# Patient Record
Sex: Female | Born: 1975 | Hispanic: Yes | Smoking: Never smoker
Health system: Southern US, Community
[De-identification: ages and names within clinical notes are randomized; demographics above are authoritative.]

## PROBLEM LIST (undated history)

## (undated) ENCOUNTER — Inpatient Hospital Stay (HOSPITAL_COMMUNITY): Payer: Self-pay

## (undated) DIAGNOSIS — Z8759 Personal history of other complications of pregnancy, childbirth and the puerperium: Secondary | ICD-10-CM

## (undated) DIAGNOSIS — O09523 Supervision of elderly multigravida, third trimester: Secondary | ICD-10-CM

## (undated) DIAGNOSIS — K8020 Calculus of gallbladder without cholecystitis without obstruction: Secondary | ICD-10-CM

---

## 2011-08-15 LAB — ANTIBODY SCREEN
Antibody Screen: NEGATIVE
Antibody Screen: NEGATIVE

## 2011-08-15 LAB — HEPATITIS B SURFACE ANTIGEN: Hepatitis B Surface Ag: NEGATIVE

## 2011-08-15 LAB — HIV ANTIBODY (ROUTINE TESTING W REFLEX): HIV: NONREACTIVE

## 2011-08-15 LAB — RPR: RPR: NONREACTIVE

## 2011-08-15 LAB — GC/CHLAMYDIA PROBE AMP, GENITAL
Chlamydia: NEGATIVE
Gonorrhea: NEGATIVE

## 2011-08-15 LAB — RUBELLA ANTIBODY, IGM
Rubella: IMMUNE
Rubella: IMMUNE

## 2011-08-15 LAB — ABO/RH: RH Type: POSITIVE

## 2011-08-16 ENCOUNTER — Other Ambulatory Visit: Payer: Self-pay | Admitting: Family Medicine

## 2011-08-16 DIAGNOSIS — Z3689 Encounter for other specified antenatal screening: Secondary | ICD-10-CM

## 2011-08-22 ENCOUNTER — Ambulatory Visit (HOSPITAL_COMMUNITY)
Admission: RE | Admit: 2011-08-22 | Discharge: 2011-08-22 | Disposition: A | Discharge status: Home / Self Care | Payer: Self-pay | Source: Ambulatory Visit | Attending: Family Medicine | Admitting: Family Medicine

## 2011-08-22 DIAGNOSIS — O09529 Supervision of elderly multigravida, unspecified trimester: Secondary | ICD-10-CM | POA: Insufficient documentation

## 2011-08-22 DIAGNOSIS — O358XX0 Maternal care for other (suspected) fetal abnormality and damage, not applicable or unspecified: Secondary | ICD-10-CM | POA: Insufficient documentation

## 2011-08-22 DIAGNOSIS — Z3689 Encounter for other specified antenatal screening: Secondary | ICD-10-CM

## 2011-08-22 DIAGNOSIS — Z363 Encounter for antenatal screening for malformations: Secondary | ICD-10-CM | POA: Insufficient documentation

## 2011-08-22 DIAGNOSIS — Z1389 Encounter for screening for other disorder: Secondary | ICD-10-CM | POA: Insufficient documentation

## 2011-11-07 NOTE — L&D Delivery Note (Signed)
Delivery Note At 9:59 PM a viable and healthy female was delivered via  (Presentation: vertex;  ).  APGAR:8 ,9 ; weight 3399g.   Placenta status:spontaneous ,intact .  Cord: 3vessel, delivered through nuchal cord  with the following complications:none .  Cord pH: not indicated  Anesthesia:  none Episiotomy: none Lacerations: none Suture Repair: not indicated Est. Blood Loss (mL):  Mom to postpartum.  Baby to nursery-stable  Cathie Beams, CNM, precepting delivery.  Cameron Proud 12/02/2011, 10:15 PM

## 2011-11-15 ENCOUNTER — Other Ambulatory Visit (HOSPITAL_COMMUNITY): Payer: Self-pay | Admitting: Physician Assistant

## 2011-11-15 DIAGNOSIS — Z3689 Encounter for other specified antenatal screening: Secondary | ICD-10-CM

## 2011-11-17 ENCOUNTER — Ambulatory Visit (HOSPITAL_COMMUNITY)
Admission: RE | Admit: 2011-11-17 | Discharge: 2011-11-17 | Disposition: A | Discharge status: Home / Self Care | Payer: Self-pay | Source: Ambulatory Visit | Attending: Physician Assistant | Admitting: Physician Assistant

## 2011-11-17 DIAGNOSIS — O09529 Supervision of elderly multigravida, unspecified trimester: Secondary | ICD-10-CM | POA: Insufficient documentation

## 2011-11-17 DIAGNOSIS — Z3689 Encounter for other specified antenatal screening: Secondary | ICD-10-CM | POA: Insufficient documentation

## 2011-11-21 ENCOUNTER — Other Ambulatory Visit (HOSPITAL_COMMUNITY): Payer: Self-pay | Admitting: Family

## 2011-11-21 DIAGNOSIS — O09299 Supervision of pregnancy with other poor reproductive or obstetric history, unspecified trimester: Secondary | ICD-10-CM

## 2011-11-21 DIAGNOSIS — Z363 Encounter for antenatal screening for malformations: Secondary | ICD-10-CM

## 2011-11-21 DIAGNOSIS — Z1389 Encounter for screening for other disorder: Secondary | ICD-10-CM

## 2011-11-21 LAB — STREP B DNA PROBE: GBS: NEGATIVE

## 2011-11-24 ENCOUNTER — Ambulatory Visit (HOSPITAL_COMMUNITY): Admission: RE | Admit: 2011-11-24 | Payer: Self-pay | Source: Ambulatory Visit

## 2011-11-28 ENCOUNTER — Ambulatory Visit (HOSPITAL_COMMUNITY)
Admission: RE | Admit: 2011-11-28 | Discharge: 2011-11-28 | Disposition: A | Discharge status: Home / Self Care | Payer: Self-pay | Source: Ambulatory Visit | Attending: Family | Admitting: Family

## 2011-11-28 DIAGNOSIS — O09299 Supervision of pregnancy with other poor reproductive or obstetric history, unspecified trimester: Secondary | ICD-10-CM | POA: Insufficient documentation

## 2011-11-28 DIAGNOSIS — O09529 Supervision of elderly multigravida, unspecified trimester: Secondary | ICD-10-CM | POA: Insufficient documentation

## 2011-12-01 ENCOUNTER — Other Ambulatory Visit (HOSPITAL_COMMUNITY): Payer: Self-pay | Admitting: Nurse Practitioner

## 2011-12-01 DIAGNOSIS — O09299 Supervision of pregnancy with other poor reproductive or obstetric history, unspecified trimester: Secondary | ICD-10-CM

## 2011-12-02 ENCOUNTER — Inpatient Hospital Stay (HOSPITAL_COMMUNITY)
Admission: AD | Admit: 2011-12-02 | Discharge: 2011-12-04 | DRG: 775 | Disposition: A | Discharge status: Home / Self Care | Payer: Medicaid Other | Source: Ambulatory Visit | Attending: Obstetrics & Gynecology | Admitting: Obstetrics & Gynecology

## 2011-12-02 ENCOUNTER — Encounter (HOSPITAL_COMMUNITY): Payer: Self-pay | Admitting: *Deleted

## 2011-12-02 DIAGNOSIS — O09529 Supervision of elderly multigravida, unspecified trimester: Secondary | ICD-10-CM

## 2011-12-02 LAB — CBC
Hemoglobin: 11.3 g/dL — ABNORMAL LOW (ref 12.0–15.0)
MCH: 27.2 pg (ref 26.0–34.0)
Platelets: 230 10*3/uL (ref 150–400)
RBC: 4.15 MIL/uL (ref 3.87–5.11)
WBC: 15.6 10*3/uL — ABNORMAL HIGH (ref 4.0–10.5)

## 2011-12-02 LAB — ABO/RH: ABO/RH(D): O POS

## 2011-12-02 MED ORDER — ONDANSETRON HCL 4 MG/2ML IJ SOLN: 4.0000 mg | INTRAMUSCULAR | Status: DC | PRN

## 2011-12-02 MED ORDER — TETANUS-DIPHTH-ACELL PERTUSSIS 5-2.5-18.5 LF-MCG/0.5 IM SUSP: 0.5000 mL | Freq: Once | INTRAMUSCULAR | Status: DC

## 2011-12-02 MED ORDER — OXYCODONE-ACETAMINOPHEN 5-325 MG PO TABS: 2.0000 | ORAL_TABLET | ORAL | Status: DC | PRN

## 2011-12-02 MED ORDER — LANOLIN HYDROUS EX OINT: TOPICAL_OINTMENT | CUTANEOUS | Status: DC | PRN

## 2011-12-02 MED ORDER — FLEET ENEMA 7-19 GM/118ML RE ENEM: 1.0000 | ENEMA | RECTAL | Status: DC | PRN

## 2011-12-02 MED ORDER — LACTATED RINGERS IV SOLN: INTRAVENOUS | Status: DC

## 2011-12-02 MED ORDER — SIMETHICONE 80 MG PO CHEW: 80.0000 mg | CHEWABLE_TABLET | ORAL | Status: DC | PRN

## 2011-12-02 MED ORDER — IBUPROFEN 600 MG PO TABS
600.0000 mg | ORAL_TABLET | Freq: Four times a day (QID) | ORAL | Status: DC
  Administered 2011-12-03 – 2011-12-04 (×6): 600 mg via ORAL
  Filled 2011-12-02 (×6): qty 1

## 2011-12-02 MED ORDER — ACETAMINOPHEN 325 MG PO TABS: 650.0000 mg | ORAL_TABLET | ORAL | Status: DC | PRN

## 2011-12-02 MED ORDER — DIPHENHYDRAMINE HCL 25 MG PO CAPS: 25.0000 mg | ORAL_CAPSULE | Freq: Four times a day (QID) | ORAL | Status: DC | PRN

## 2011-12-02 MED ORDER — BENZOCAINE-MENTHOL 20-0.5 % EX AERO: 1.0000 "application " | INHALATION_SPRAY | CUTANEOUS | Status: DC | PRN

## 2011-12-02 MED ORDER — SENNOSIDES-DOCUSATE SODIUM 8.6-50 MG PO TABS
2.0000 | ORAL_TABLET | Freq: Every day | ORAL | Status: DC
  Administered 2011-12-03: 2 via ORAL

## 2011-12-02 MED ORDER — PRENATAL MULTIVITAMIN CH
1.0000 | ORAL_TABLET | Freq: Every day | ORAL | Status: DC
  Administered 2011-12-03 – 2011-12-04 (×2): 1 via ORAL
  Filled 2011-12-02 (×2): qty 1

## 2011-12-02 MED ORDER — WITCH HAZEL-GLYCERIN EX PADS: 1.0000 "application " | MEDICATED_PAD | CUTANEOUS | Status: DC | PRN

## 2011-12-02 MED ORDER — ONDANSETRON HCL 4 MG/2ML IJ SOLN: 4.0000 mg | Freq: Four times a day (QID) | INTRAMUSCULAR | Status: DC | PRN

## 2011-12-02 MED ORDER — ONDANSETRON HCL 4 MG PO TABS: 4.0000 mg | ORAL_TABLET | ORAL | Status: DC | PRN

## 2011-12-02 MED ORDER — LACTATED RINGERS IV SOLN: 500.0000 mL | INTRAVENOUS | Status: DC | PRN

## 2011-12-02 MED ORDER — NALBUPHINE SYRINGE 5 MG/0.5 ML: 5.0000 mg | INJECTION | INTRAMUSCULAR | Status: DC | PRN

## 2011-12-02 MED ORDER — FENTANYL CITRATE 0.05 MG/ML IJ SOLN
50.0000 ug | INTRAMUSCULAR | Status: DC | PRN
  Administered 2011-12-02: 50 ug via INTRAVENOUS
  Filled 2011-12-02: qty 2

## 2011-12-02 MED ORDER — ZOLPIDEM TARTRATE 5 MG PO TABS: 5.0000 mg | ORAL_TABLET | Freq: Every evening | ORAL | Status: DC | PRN

## 2011-12-02 MED ORDER — LIDOCAINE HCL (PF) 1 % IJ SOLN
30.0000 mL | INTRAMUSCULAR | Status: DC | PRN
  Filled 2011-12-02: qty 30

## 2011-12-02 MED ORDER — OXYTOCIN 20 UNITS IN LACTATED RINGERS INFUSION - SIMPLE
125.0000 mL/h | Freq: Once | INTRAVENOUS | Status: AC
  Administered 2011-12-02: 500 mL/h via INTRAVENOUS

## 2011-12-02 MED ORDER — DIBUCAINE 1 % RE OINT: 1.0000 "application " | TOPICAL_OINTMENT | RECTAL | Status: DC | PRN

## 2011-12-02 MED ORDER — OXYCODONE-ACETAMINOPHEN 5-325 MG PO TABS
1.0000 | ORAL_TABLET | ORAL | Status: DC | PRN
  Administered 2011-12-03 (×2): 1 via ORAL
  Filled 2011-12-02 (×2): qty 1

## 2011-12-02 MED ORDER — OXYTOCIN BOLUS FROM INFUSION
500.0000 mL | Freq: Once | INTRAVENOUS | Status: DC
  Filled 2011-12-02: qty 500
  Filled 2011-12-02: qty 1000

## 2011-12-02 MED ORDER — CITRIC ACID-SODIUM CITRATE 334-500 MG/5ML PO SOLN: 30.0000 mL | ORAL | Status: DC | PRN

## 2011-12-02 MED ORDER — IBUPROFEN 600 MG PO TABS: 600.0000 mg | ORAL_TABLET | Freq: Four times a day (QID) | ORAL | Status: DC | PRN

## 2011-12-02 NOTE — Progress Notes (Signed)
Possible SROM at 1100 today, clear fluid.

## 2011-12-02 NOTE — Progress Notes (Signed)
Pt transferred to room 136 at 2315 via wheelchair, support person present, report given to mbw rn

## 2011-12-02 NOTE — H&P (Signed)
Beverly Flynn is a 36 y.o. female presenting for SROM  Mother has no complaints., No complications with this pregnancy , good fetal movement SROM around 11am today. Denies fevers. Maternal Medical History:  Reason for admission: Reason for admission: rupture of membranes.  Reason for Admission:   nauseaContractions: Frequency: regular.   Perceived severity is strong.    Fetal activity: Perceived fetal activity is normal.   Last perceived fetal movement was within the past hour.      OB History    Grav Para Term Preterm Abortions TAB SAB Ect Mult Living   8 6 6  1  1   6      Past Medical History  Diagnosis Date  . No pertinent past medical history    Past Surgical History  Procedure Date  . No past surgeries    Family History: family history is not on file. Social History:  reports that she has never smoked. She has never used smokeless tobacco. She reports that she does not drink alcohol or use illicit drugs.  Review of Systems  Constitutional: Negative for fever and chills.  Eyes: Negative for blurred vision and double vision.  Cardiovascular: Negative for chest pain.  Gastrointestinal: Negative for nausea and vomiting.  Skin: Negative for itching and rash.  Neurological: Negative for headaches.  All other systems reviewed and are negative.    Dilation: 6.5 Effacement (%): 100 Station: -1 Exam by:: e.foley,rn Blood pressure 135/68, pulse 106, temperature 98.4 F (36.9 C), temperature source Oral, resp. rate 20, height 5\' 1"  (1.549 m), weight 176 lb (79.833 kg). Maternal Exam:  Uterine Assessment: Contraction strength is firm.  Introitus: Normal vulva. Normal vagina.  Ferning test: positive.      Fetal Exam Fetal Monitor Review: Variability: moderate (6-25 bpm).   Pattern: accelerations present.    Fetal State Assessment: Category I - tracings are normal.     Physical Exam  Nursing note and vitals reviewed. Constitutional: She is oriented to  person, place, and time. She appears well-developed and well-nourished. No distress.  HENT:  Head: Normocephalic and atraumatic.  Eyes: EOM are normal. Pupils are equal, round, and reactive to light.  Cardiovascular: Normal rate, regular rhythm, normal heart sounds and intact distal pulses.  Exam reveals no gallop and no friction rub.   No murmur heard. Respiratory: Breath sounds normal. No respiratory distress. She has no wheezes. She has no rales. She exhibits no tenderness.  GI: Soft. She exhibits no distension and no mass. There is no tenderness. There is no rebound and no guarding.  Musculoskeletal: Normal range of motion. She exhibits no edema and no tenderness.  Neurological: She is alert and oriented to person, place, and time. She has normal reflexes. No cranial nerve deficit. Coordination normal.  Skin: Skin is warm and dry. She is not diaphoretic.  Psychiatric: She has a normal mood and affect. Her behavior is normal. Judgment and thought content normal.    Prenatal labs: ABO, Rh: O, O/Positive, Positive/-- (10/09 0000) Antibody: Negative, Negative (10/09 0000) Rubella: Immune, Immune (10/09 0000) RPR: Nonreactive, Nonreactive (10/09 0000)  HBsAg: Negative, Negative (10/09 0000)  HIV: Non-reactive, Non-reactive (10/09 0000)  GBS: Negative (01/16 0000)   Assessment/Plan: SROM  - admit to L&D  - expect SVD  - GBS neg  - fentanyl for pain control  - routine care  Cameron Proud 12/02/2011, 10:27 PM

## 2011-12-03 LAB — RPR: RPR Ser Ql: NONREACTIVE

## 2011-12-03 NOTE — H&P (Signed)
Attestation of Attending Supervision of Resident: Evaluation and management procedures were performed by the [facility] Resident under my supervision.  I have reviewed the resident's note, chart reviewed and agree with management and plan.  Jaynie Collins, M.D. 12/03/2011 7:34 AM

## 2011-12-03 NOTE — Progress Notes (Signed)
Post Partum Day 1 Subjective: no complaints, up ad lib, voiding, tolerating PO and + flatus  Objective: Blood pressure 118/78, pulse 80, temperature 97.9 F (36.6 C), temperature source Oral, resp. rate 18, height 5\' 1"  (1.549 m), weight 176 lb (79.833 kg), SpO2 98.00%, unknown if currently breastfeeding.  Physical Exam:  General: no distress Lochia: appropriate Uterine Fundus: firm Incision: N/A DVT Evaluation: No evidence of DVT seen on physical exam. No cords or calf tenderness. No significant calf/ankle edema.   Basename 12/02/11 2135  HGB 11.3*  HCT 34.5*    Assessment/Plan: Breast or bottle-feeding: bottle Contraception: Micrnor Circumcision: not desired Anticipate discharge tomorrow Follow-up: HD    LOS: 1 day   OH PARK, ANGELA 12/03/2011, 7:28 AM

## 2011-12-04 ENCOUNTER — Ambulatory Visit (HOSPITAL_COMMUNITY): Payer: Self-pay

## 2011-12-04 MED ORDER — IBUPROFEN 600 MG PO TABS: 600.0000 mg | ORAL_TABLET | Freq: Four times a day (QID) | ORAL | Status: AC

## 2011-12-04 NOTE — Progress Notes (Signed)
UR chart review completed.  

## 2011-12-04 NOTE — Progress Notes (Signed)
Post Partum Day 2 Subjective: no complaints, up ad lib, voiding, tolerating PO and + flatus  Objective: Blood pressure 104/74, pulse 83, temperature 97.6 F (36.4 C), temperature source Oral, resp. rate 18, height 5\' 1"  (1.549 m), weight 79.833 kg (176 lb), SpO2 98.00%, unknown if currently breastfeeding.  Physical Exam:  General: alert, cooperative and no distress Lochia: appropriate Uterine Fundus: firm DVT Evaluation: No evidence of DVT seen on physical exam. Negative Homan's sign.   Basename 12/02/11 2135  HGB 11.3*  HCT 34.5*    Assessment/Plan: Discharge home and Contraception IUD Bottle feeding.   LOS: 2 days    D. Piloto [employer]. MD PGY-1 12/04/2011, 9:37 AM

## 2011-12-04 NOTE — Progress Notes (Signed)
[facility] Department of Clinical Social Work Documentation of Interpretation   I assisted ___________________ with interpretation of _stopped by to check on patient_____________________ for this patient. 

## 2011-12-04 NOTE — Discharge Summary (Signed)
Obstetric Discharge Summary Reason for Admission: rupture of membranes Prenatal Procedures: ultrasound Intrapartum Procedures: spontaneous vaginal delivery Postpartum Procedures: none Complications-Operative and Postpartum: none Hemoglobin  Date Value Range Status  12/02/2011 11.3* 12.0-15.0 (g/dL) Final     HCT  Date Value Range Status  12/02/2011 34.5* 36.0-46.0 (%) Final    Discharge Diagnoses: Term Pregnancy-delivered  Discharge Information: Date: 12/04/2011 Activity: unrestricted Diet: routine Medications: Ibuprofen Condition: stable Instructions: refer to practice specific booklet Discharge to: home Follow-up Information    Follow up with [facility] (haga Neomia Dear cita en 6 semanas.)    Contact information:   [address] [place] [address] [phone]         Newborn Data: Live born female  Birth Weight: 7 lb 7.9 oz (3399 g) APGAR: 8, 9  Home with mother.  PILOTO, DAYARMYS 12/04/2011, 9:45 AM

## 2011-12-05 ENCOUNTER — Ambulatory Visit (HOSPITAL_COMMUNITY): Admission: RE | Admit: 2011-12-05 | Payer: Self-pay | Source: Ambulatory Visit

## 2011-12-07 ENCOUNTER — Encounter (HOSPITAL_COMMUNITY): Payer: Self-pay

## 2011-12-08 ENCOUNTER — Ambulatory Visit (HOSPITAL_COMMUNITY): Payer: Self-pay

## 2013-07-16 ENCOUNTER — Inpatient Hospital Stay (HOSPITAL_COMMUNITY): Payer: Medicaid Other

## 2013-07-16 ENCOUNTER — Encounter (HOSPITAL_COMMUNITY): Payer: Self-pay | Admitting: *Deleted

## 2013-07-16 ENCOUNTER — Inpatient Hospital Stay (HOSPITAL_COMMUNITY)
Admission: AD | Admit: 2013-07-16 | Discharge: 2013-07-16 | Disposition: A | Discharge status: Home / Self Care | Payer: Self-pay | Source: Ambulatory Visit | Attending: Family Medicine | Admitting: Family Medicine

## 2013-07-16 DIAGNOSIS — K8020 Calculus of gallbladder without cholecystitis without obstruction: Secondary | ICD-10-CM | POA: Insufficient documentation

## 2013-07-16 DIAGNOSIS — R1011 Right upper quadrant pain: Secondary | ICD-10-CM | POA: Insufficient documentation

## 2013-07-16 DIAGNOSIS — O9989 Other specified diseases and conditions complicating pregnancy, childbirth and the puerperium: Secondary | ICD-10-CM | POA: Insufficient documentation

## 2013-07-16 DIAGNOSIS — O26619 Liver and biliary tract disorders in pregnancy, unspecified trimester: Secondary | ICD-10-CM

## 2013-07-16 LAB — CBC WITH DIFFERENTIAL/PLATELET
Eosinophils Absolute: 0.1 10*3/uL (ref 0.0–0.7)
Hemoglobin: 10.5 g/dL — ABNORMAL LOW (ref 12.0–15.0)
Lymphocytes Relative: 22 % (ref 12–46)
Lymphs Abs: 2.5 10*3/uL (ref 0.7–4.0)
MCH: 28.9 pg (ref 26.0–34.0)
Monocytes Relative: 7 % (ref 3–12)
Neutrophils Relative %: 70 % (ref 43–77)
RBC: 3.63 MIL/uL — ABNORMAL LOW (ref 3.87–5.11)

## 2013-07-16 LAB — URINALYSIS, ROUTINE W REFLEX MICROSCOPIC
Ketones, ur: NEGATIVE mg/dL
Leukocytes, UA: NEGATIVE
Nitrite: NEGATIVE
Protein, ur: NEGATIVE mg/dL

## 2013-07-16 LAB — COMPREHENSIVE METABOLIC PANEL
Alkaline Phosphatase: 70 U/L (ref 39–117)
BUN: 11 mg/dL (ref 6–23)
CO2: 24 mEq/L (ref 19–32)
Chloride: 102 mEq/L (ref 96–112)
GFR calc Af Amer: >90 mL/min (ref >90)
GFR calc non Af Amer: >90 mL/min (ref >90)
Glucose, Bld: 97 mg/dL (ref 70–99)
Potassium: 3.3 mEq/L — ABNORMAL LOW (ref 3.5–5.1)
Total Bilirubin: 0.2 mg/dL — ABNORMAL LOW (ref 0.3–1.2)
Total Protein: 6 g/dL (ref 6.0–8.3)

## 2013-07-16 LAB — URINE MICROSCOPIC-ADD ON

## 2013-07-16 LAB — AMYLASE: Amylase: 94 U/L (ref 0–105)

## 2013-07-16 MED ORDER — ONDANSETRON 4 MG PO TBDP
4.0000 mg | ORAL_TABLET | Freq: Once | ORAL | Status: AC
  Administered 2013-07-16: 4 mg via ORAL
  Filled 2013-07-16: qty 1

## 2013-07-16 MED ORDER — TRAMADOL HCL 50 MG PO TABS: 50.0000 mg | ORAL_TABLET | Freq: Three times a day (TID) | ORAL | Status: DC | PRN

## 2013-07-16 MED ORDER — TRAMADOL HCL 50 MG PO TABS
50.0000 mg | ORAL_TABLET | Freq: Once | ORAL | Status: AC
  Administered 2013-07-16: 50 mg via ORAL
  Filled 2013-07-16: qty 1

## 2013-07-16 MED ORDER — ONDANSETRON 4 MG PO TBDP: 4.0000 mg | ORAL_TABLET | Freq: Once | ORAL | Status: DC

## 2013-07-16 NOTE — MAU Provider Note (Signed)
Chart reviewed and agree with management and plan.  

## 2013-07-16 NOTE — MAU Provider Note (Signed)
History     CSN: [id number]  Arrival date and time: 07/16/13 0135   None     No chief complaint on file.  HPI This is a 37 y.o. female at [redacted]w[redacted]d who presents with c/o RUQ pain and vomiting since yesterday. Pain starts under right rib and goes through to her back. Denies fever. No lower pain or bleeding. Has new OB [facility] this Thursday.  RN Note: Pt reports pain in upper abd and mid back since yesterday off/on. Vomiting x 1-2 hours.       OB History   Grav Para Term Preterm Abortions TAB SAB Ect Mult Living   8 6 6  1  1   6       Past Medical History  Diagnosis Date  . No pertinent past medical history   . Medical history non-contributory     Past Surgical History  Procedure Laterality Date  . No past surgeries      History reviewed. No pertinent family history.  History  Substance Use Topics  . Smoking status: Never Smoker   . Smokeless tobacco: Never Used  . Alcohol Use: No    Allergies: No Known Allergies  Prescriptions prior to admission  Medication Sig Dispense Refill  . Prenatal Vit-Fe Fumarate-FA (PRENATAL MULTIVITAMIN) TABS Take 1 tablet by mouth daily.      Marland Kitchen acetaminophen (TYLENOL) 325 MG tablet Take 650 mg by mouth every 6 (six) hours as needed. pain        Review of Systems  Constitutional: Negative for fever and chills.  Respiratory: Negative for cough.   Gastrointestinal: Positive for nausea, vomiting and abdominal pain. Negative for diarrhea and constipation.  Genitourinary: Negative for dysuria.  Neurological: Negative for dizziness and headaches.   Physical Exam   Blood pressure 110/62, pulse 95, temperature 98.1 F (36.7 C), temperature source Oral, resp. rate 20, height 4\' 11"  (1.499 m), weight 71.668 kg (158 lb), last menstrual period 03/11/2013, SpO2 100.00%.  Physical Exam  Constitutional: She is oriented to person, place, and time. She appears well-developed and well-nourished. No distress (but appears very uncomfortable).   HENT:  Head: Normocephalic.  Cardiovascular: Normal rate.   Respiratory: Effort normal.  GI: Soft. She exhibits no distension and no mass. There is tenderness. There is guarding (mild guarding over RUQ, + Murphy sign). There is no rebound.  Musculoskeletal: Normal range of motion.  Neurological: She is alert and oriented to person, place, and time.  Skin: Skin is warm and dry.  Psychiatric: She has a normal mood and affect.    MAU Course  Procedures  MDM Will check labs and get RUQ ultrasound Results for orders placed during the hospital encounter of 07/16/13 (from the past 24 hour(s))  URINALYSIS, ROUTINE W REFLEX MICROSCOPIC     Status: Abnormal   Collection Time    07/16/13  1:35 AM      Result Value Range   Color, Urine YELLOW  YELLOW   APPearance CLEAR  CLEAR   Specific Gravity, Urine 1.010  1.005 - 1.030   pH 7.0  5.0 - 8.0   Glucose, UA NEGATIVE  NEGATIVE mg/dL   Hgb urine dipstick TRACE (*) NEGATIVE   Bilirubin Urine NEGATIVE  NEGATIVE   Ketones, ur NEGATIVE  NEGATIVE mg/dL   Protein, ur NEGATIVE  NEGATIVE mg/dL   Urobilinogen, UA 0.2  0.0 - 1.0 mg/dL   Nitrite NEGATIVE  NEGATIVE   Leukocytes, UA NEGATIVE  NEGATIVE  URINE MICROSCOPIC-ADD ON  Status: Abnormal   Collection Time    07/16/13  1:35 AM      Result Value Range   Squamous Epithelial / LPF RARE  RARE   WBC, UA 0-2  <3 WBC/hpf   RBC / HPF 0-2  <3 RBC/hpf   Bacteria, UA FEW (*) RARE   Urine-Other AMORPHOUS URATES/PHOSPHATES    CBC WITH DIFFERENTIAL     Status: Abnormal   Collection Time    07/16/13  2:12 AM      Result Value Range   WBC 11.5 (*) 4.0 - 10.5 K/uL   RBC 3.63 (*) 3.87 - 5.11 MIL/uL   Hemoglobin 10.5 (*) 12.0 - 15.0 g/dL   HCT 16.1 (*) 09.6 - 04.5 %   MCV 84.8  78.0 - 100.0 fL   MCH 28.9  26.0 - 34.0 pg   MCHC 34.1  30.0 - 36.0 g/dL   RDW 40.9  81.1 - 91.4 %   Platelets 227  150 - 400 K/uL   Neutrophils Relative % 70  43 - 77 %   Neutro Abs 8.0 (*) 1.7 - 7.7 K/uL   Lymphocytes  Relative 22  12 - 46 %   Lymphs Abs 2.5  0.7 - 4.0 K/uL   Monocytes Relative 7  3 - 12 %   Monocytes Absolute 0.8  0.1 - 1.0 K/uL   Eosinophils Relative 1  0 - 5 %   Eosinophils Absolute 0.1  0.0 - 0.7 K/uL   Basophils Relative 0  0 - 1 %   Basophils Absolute 0.0  0.0 - 0.1 K/uL  COMPREHENSIVE METABOLIC PANEL     Status: Abnormal   Collection Time    07/16/13  2:12 AM      Result Value Range   Sodium 135  135 - 145 mEq/L   Potassium 3.3 (*) 3.5 - 5.1 mEq/L   Chloride 102  96 - 112 mEq/L   CO2 24  19 - 32 mEq/L   Glucose, Bld 97  70 - 99 mg/dL   BUN 11  6 - 23 mg/dL   Creatinine, Ser 7.82  0.50 - 1.10 mg/dL   Calcium 9.2  8.4 - 95.6 mg/dL   Total Protein 6.0  6.0 - 8.3 g/dL   Albumin 2.7 (*) 3.5 - 5.2 g/dL   AST 12  0 - 37 U/L   ALT 7  0 - 35 U/L   Alkaline Phosphatase 70  39 - 117 U/L   Total Bilirubin 0.2 (*) 0.3 - 1.2 mg/dL   GFR calc non Af Amer >90  >90 mL/min   GFR calc Af Amer >90  >90 mL/min  AMYLASE     Status: None   Collection Time    07/16/13  2:12 AM      Result Value Range   Amylase 94  0 - 105 U/L  LIPASE, BLOOD     Status: None   Collection Time    07/16/13  2:12 AM      Result Value Range   Lipase 38  11 - 59 U/L   US Abdomen Limited Ruq  07/16/2013   *RADIOLOGY REPORT*  Clinical Data:  Upper abdominal and mid back pain.  The patient is [redacted] weeks pregnant.  Increased white cell count 11.5.  Amylase and lipase are normal.  LIMITED ABDOMINAL ULTRASOUND - RIGHT UPPER QUADRANT  Comparison:  None.    Findings:  Gallbladder:  Multiple stones in the dependent portion of the gallbladder.  No gallbladder wall  thickening, edema, or sludge. Murphy's sign is negative.  Common bile duct:  No dilatation.  Diameter measures 4.1 mm.  Liver:  Normal homogeneous liver parenchymal echotexture.  No focal lesions identified.    IMPRESSION: Cholelithiasis without additional changes of cholecystitis.                      Original Report Authenticated By: Burman Nieves, M.D.    Assessment and Plan  A:  SIUP at [redacted]w[redacted]d        Cholelithiasis      No evidence of cholecystitis  P:  Discussed results      Has appt at HD this Thursday      Will refer to surgery for recommendations      Rx Tramadol and Zofran (pain resolved while here even without meds)      Low fat diet  [facility] 07/16/2013, 2:11 AM

## 2013-07-16 NOTE — MAU Note (Signed)
Pt reports pain in upper abd and mid back since yesterday off/on. Vomiting x 1-2 hours.

## 2013-07-18 ENCOUNTER — Telehealth: Payer: Self-pay | Admitting: *Deleted

## 2013-07-18 NOTE — Telephone Encounter (Addendum)
Message copied by Jill Side on Fri Jul 18, 2013 12:42 PM ------      Message from: [place], [place] L      Created: Wed Jul 16, 2013  4:34 AM      Regarding: referral to surgery       This patient is 18 wks and has her first appt with [facility] this week.             Came in and was diagnosed with gallstones, therefore needing referral to surgery for eval.            Not sure who to ask to do the referral.            Can you do it?  Or can you refer to health dept to do it ? Or would it be [place] and [place]? ------  Returned a message to Hilda Lias stating that this pt does not have insurance and will be expected to pay atleast $250 at her first appt. Will wait for response.

## 2013-07-21 ENCOUNTER — Other Ambulatory Visit (HOSPITAL_COMMUNITY): Payer: Self-pay | Admitting: Physician Assistant

## 2013-07-21 DIAGNOSIS — Z0489 Encounter for examination and observation for other specified reasons: Secondary | ICD-10-CM

## 2013-07-21 NOTE — Telephone Encounter (Signed)
Called and spoke with Linus Orn RN @ [facility]. I informed her that pt had MAU visit and was diagnosed with gallstones. She may have surgical referral but will likely require out of pocket finances. Corrie Dandy stated that she will follow up and be sure that a provider explains the options for care to the pt.  Pt had new Ob visit there today.

## 2013-07-23 ENCOUNTER — Other Ambulatory Visit (HOSPITAL_COMMUNITY): Payer: Self-pay | Admitting: Physician Assistant

## 2013-07-23 ENCOUNTER — Ambulatory Visit (HOSPITAL_COMMUNITY)
Admission: RE | Admit: 2013-07-23 | Discharge: 2013-07-23 | Disposition: A | Discharge status: Home / Self Care | Payer: Medicaid Other | Source: Ambulatory Visit | Attending: Physician Assistant | Admitting: Physician Assistant

## 2013-07-23 ENCOUNTER — Ambulatory Visit (HOSPITAL_COMMUNITY)
Admission: RE | Admit: 2013-07-23 | Discharge: 2013-07-23 | Disposition: A | Discharge status: Home / Self Care | Payer: MEDICAID | Source: Ambulatory Visit | Attending: Physician Assistant | Admitting: Physician Assistant

## 2013-07-23 DIAGNOSIS — Z3689 Encounter for other specified antenatal screening: Secondary | ICD-10-CM | POA: Insufficient documentation

## 2013-07-23 DIAGNOSIS — Z0489 Encounter for examination and observation for other specified reasons: Secondary | ICD-10-CM

## 2013-07-23 DIAGNOSIS — O09529 Supervision of elderly multigravida, unspecified trimester: Secondary | ICD-10-CM | POA: Insufficient documentation

## 2013-07-25 LAB — OB RESULTS CONSOLE RPR: RPR: NONREACTIVE

## 2013-08-18 ENCOUNTER — Other Ambulatory Visit (HOSPITAL_COMMUNITY): Payer: Self-pay | Admitting: Nurse Practitioner

## 2013-08-18 DIAGNOSIS — Z0489 Encounter for examination and observation for other specified reasons: Secondary | ICD-10-CM

## 2013-10-16 ENCOUNTER — Ambulatory Visit (HOSPITAL_COMMUNITY)
Admission: RE | Admit: 2013-10-16 | Discharge: 2013-10-16 | Disposition: A | Discharge status: Home / Self Care | Payer: Self-pay | Source: Ambulatory Visit | Attending: Nurse Practitioner | Admitting: Nurse Practitioner

## 2013-10-16 DIAGNOSIS — O358XX0 Maternal care for other (suspected) fetal abnormality and damage, not applicable or unspecified: Secondary | ICD-10-CM | POA: Insufficient documentation

## 2013-10-16 DIAGNOSIS — Z0489 Encounter for examination and observation for other specified reasons: Secondary | ICD-10-CM

## 2013-10-16 DIAGNOSIS — Z363 Encounter for antenatal screening for malformations: Secondary | ICD-10-CM | POA: Insufficient documentation

## 2013-10-16 DIAGNOSIS — Z1389 Encounter for screening for other disorder: Secondary | ICD-10-CM | POA: Insufficient documentation

## 2013-11-06 NOTE — L&D Delivery Note (Signed)
Delivery Note End stage bradycardia 60's. Ritgens done. Nuchal cord reduced over head. McRoberts done with immediate restitution and delivery ensued. Cytotec 800 mcg pr given prophylactically Weak cry, poor tone. Cord pH done.  At 10:43 AM a viable female was delivered via Vaginal, Spontaneous Delivery (Presentation: Left Occiput Anterior).  APGAR:4 ,9 ; weight pending.   Placenta status: intact 3VC, .  Cord:  with the following complications: long about 50 inches.  Cord pH arterial: 7.09 Anesthesia: None  Episiotomy: None Lacerations: None Suture Repair: n/a Est. Blood Loss (mL): 300;   Mom to postpartum.  Baby to Nursery.  Khadeja Abt 12/03/2013, 10:56 AM

## 2013-11-13 LAB — OB RESULTS CONSOLE GC/CHLAMYDIA
CHLAMYDIA, DNA PROBE: NEGATIVE
GC PROBE AMP, GENITAL: NEGATIVE

## 2013-11-15 LAB — OB RESULTS CONSOLE RPR: RPR: NONREACTIVE

## 2013-11-15 LAB — OB RESULTS CONSOLE HIV ANTIBODY (ROUTINE TESTING): HIV: NONREACTIVE

## 2013-11-27 ENCOUNTER — Other Ambulatory Visit (HOSPITAL_COMMUNITY): Payer: Self-pay | Admitting: Nurse Practitioner

## 2013-12-01 ENCOUNTER — Ambulatory Visit (HOSPITAL_COMMUNITY)
Admission: RE | Admit: 2013-12-01 | Discharge: 2013-12-01 | Disposition: A | Discharge status: Home / Self Care | Payer: Self-pay | Source: Ambulatory Visit | Attending: Nurse Practitioner | Admitting: Nurse Practitioner

## 2013-12-01 DIAGNOSIS — O358XX0 Maternal care for other (suspected) fetal abnormality and damage, not applicable or unspecified: Secondary | ICD-10-CM | POA: Insufficient documentation

## 2013-12-01 DIAGNOSIS — Z363 Encounter for antenatal screening for malformations: Secondary | ICD-10-CM | POA: Insufficient documentation

## 2013-12-01 DIAGNOSIS — O3660X0 Maternal care for excessive fetal growth, unspecified trimester, not applicable or unspecified: Secondary | ICD-10-CM | POA: Insufficient documentation

## 2013-12-01 DIAGNOSIS — Z1389 Encounter for screening for other disorder: Secondary | ICD-10-CM | POA: Insufficient documentation

## 2013-12-01 DIAGNOSIS — O09529 Supervision of elderly multigravida, unspecified trimester: Secondary | ICD-10-CM | POA: Insufficient documentation

## 2013-12-03 ENCOUNTER — Inpatient Hospital Stay (HOSPITAL_COMMUNITY)
Admission: AD | Admit: 2013-12-03 | Discharge: 2013-12-04 | DRG: 775 | Disposition: A | Discharge status: Home / Self Care | Payer: Medicaid Other | Source: Ambulatory Visit | Attending: Obstetrics & Gynecology | Admitting: Obstetrics & Gynecology

## 2013-12-03 ENCOUNTER — Encounter (HOSPITAL_COMMUNITY): Payer: Self-pay | Admitting: *Deleted

## 2013-12-03 DIAGNOSIS — O3660X0 Maternal care for excessive fetal growth, unspecified trimester, not applicable or unspecified: Principal | ICD-10-CM | POA: Diagnosis present

## 2013-12-03 DIAGNOSIS — IMO0001 Reserved for inherently not codable concepts without codable children: Secondary | ICD-10-CM

## 2013-12-03 DIAGNOSIS — K8020 Calculus of gallbladder without cholecystitis without obstruction: Secondary | ICD-10-CM | POA: Diagnosis present

## 2013-12-03 DIAGNOSIS — O26899 Other specified pregnancy related conditions, unspecified trimester: Secondary | ICD-10-CM

## 2013-12-03 DIAGNOSIS — O09529 Supervision of elderly multigravida, unspecified trimester: Secondary | ICD-10-CM | POA: Diagnosis present

## 2013-12-03 LAB — CBC
HCT: 32.6 % — ABNORMAL LOW (ref 36.0–46.0)
HEMOGLOBIN: 10.3 g/dL — AB (ref 12.0–15.0)
MCH: 24.2 pg — AB (ref 26.0–34.0)
MCHC: 31.6 g/dL (ref 30.0–36.0)
MCV: 76.5 fL — ABNORMAL LOW (ref 78.0–100.0)
Platelets: 248 10*3/uL (ref 150–400)
RBC: 4.26 MIL/uL (ref 3.87–5.11)
RDW: 15.7 % — AB (ref 11.5–15.5)
WBC: 12.5 10*3/uL — ABNORMAL HIGH (ref 4.0–10.5)

## 2013-12-03 LAB — RPR: RPR: NONREACTIVE

## 2013-12-03 LAB — TYPE AND SCREEN
ABO/RH(D): O POS
ANTIBODY SCREEN: NEGATIVE

## 2013-12-03 LAB — OB RESULTS CONSOLE GBS: STREP GROUP B AG: NEGATIVE

## 2013-12-03 MED ORDER — ERYTHROMYCIN 5 MG/GM OP OINT
TOPICAL_OINTMENT | OPHTHALMIC | Status: AC
  Filled 2013-12-03: qty 1

## 2013-12-03 MED ORDER — FENTANYL CITRATE 0.05 MG/ML IJ SOLN
INTRAMUSCULAR | Status: AC
  Filled 2013-12-03: qty 2

## 2013-12-03 MED ORDER — OXYTOCIN 40 UNITS IN LACTATED RINGERS INFUSION - SIMPLE MED
62.5000 mL/h | INTRAVENOUS | Status: DC
  Administered 2013-12-03: 62.5 mL/h via INTRAVENOUS
  Filled 2013-12-03: qty 1000

## 2013-12-03 MED ORDER — BENZOCAINE-MENTHOL 20-0.5 % EX AERO: 1.0000 "application " | INHALATION_SPRAY | CUTANEOUS | Status: DC | PRN

## 2013-12-03 MED ORDER — WITCH HAZEL-GLYCERIN EX PADS: 1.0000 "application " | MEDICATED_PAD | CUTANEOUS | Status: DC | PRN

## 2013-12-03 MED ORDER — LANOLIN HYDROUS EX OINT: TOPICAL_OINTMENT | CUTANEOUS | Status: DC | PRN

## 2013-12-03 MED ORDER — OXYCODONE-ACETAMINOPHEN 5-325 MG PO TABS
1.0000 | ORAL_TABLET | ORAL | Status: DC | PRN
  Administered 2013-12-03: 1 via ORAL
  Filled 2013-12-03: qty 1

## 2013-12-03 MED ORDER — LACTATED RINGERS IV SOLN: 500.0000 mL | INTRAVENOUS | Status: DC | PRN

## 2013-12-03 MED ORDER — LIDOCAINE HCL (PF) 1 % IJ SOLN
30.0000 mL | INTRAMUSCULAR | Status: DC | PRN
  Filled 2013-12-03 (×2): qty 30

## 2013-12-03 MED ORDER — DIBUCAINE 1 % RE OINT: 1.0000 "application " | TOPICAL_OINTMENT | RECTAL | Status: DC | PRN

## 2013-12-03 MED ORDER — TETANUS-DIPHTH-ACELL PERTUSSIS 5-2.5-18.5 LF-MCG/0.5 IM SUSP: 0.5000 mL | Freq: Once | INTRAMUSCULAR | Status: DC

## 2013-12-03 MED ORDER — ZOLPIDEM TARTRATE 5 MG PO TABS: 5.0000 mg | ORAL_TABLET | Freq: Every evening | ORAL | Status: DC | PRN

## 2013-12-03 MED ORDER — SENNOSIDES-DOCUSATE SODIUM 8.6-50 MG PO TABS
2.0000 | ORAL_TABLET | ORAL | Status: DC
  Administered 2013-12-04: 2 via ORAL
  Filled 2013-12-03: qty 2

## 2013-12-03 MED ORDER — LACTATED RINGERS IV SOLN: INTRAVENOUS | Status: DC

## 2013-12-03 MED ORDER — ACETAMINOPHEN 325 MG PO TABS: 650.0000 mg | ORAL_TABLET | ORAL | Status: DC | PRN

## 2013-12-03 MED ORDER — ONDANSETRON HCL 4 MG/2ML IJ SOLN: 4.0000 mg | Freq: Four times a day (QID) | INTRAMUSCULAR | Status: DC | PRN

## 2013-12-03 MED ORDER — MISOPROSTOL 200 MCG PO TABS
800.0000 ug | ORAL_TABLET | Freq: Once | ORAL | Status: AC
  Administered 2013-12-03: 800 ug via VAGINAL

## 2013-12-03 MED ORDER — IBUPROFEN 600 MG PO TABS
600.0000 mg | ORAL_TABLET | Freq: Four times a day (QID) | ORAL | Status: DC
  Administered 2013-12-03 – 2013-12-04 (×4): 600 mg via ORAL
  Filled 2013-12-03 (×4): qty 1

## 2013-12-03 MED ORDER — FENTANYL CITRATE 0.05 MG/ML IJ SOLN
100.0000 ug | INTRAMUSCULAR | Status: DC | PRN
  Administered 2013-12-03: 100 ug via INTRAVENOUS

## 2013-12-03 MED ORDER — SIMETHICONE 80 MG PO CHEW: 80.0000 mg | CHEWABLE_TABLET | ORAL | Status: DC | PRN

## 2013-12-03 MED ORDER — CITRIC ACID-SODIUM CITRATE 334-500 MG/5ML PO SOLN: 30.0000 mL | ORAL | Status: DC | PRN

## 2013-12-03 MED ORDER — OXYTOCIN BOLUS FROM INFUSION: 500.0000 mL | INTRAVENOUS | Status: DC

## 2013-12-03 MED ORDER — MISOPROSTOL 200 MCG PO TABS
ORAL_TABLET | ORAL | Status: AC
  Filled 2013-12-03: qty 4

## 2013-12-03 MED ORDER — IBUPROFEN 600 MG PO TABS
600.0000 mg | ORAL_TABLET | Freq: Four times a day (QID) | ORAL | Status: DC | PRN
Start: 2013-12-03 — End: 2013-12-04
  Administered 2013-12-03: 600 mg via ORAL
  Filled 2013-12-03: qty 1

## 2013-12-03 MED ORDER — PRENATAL MULTIVITAMIN CH
1.0000 | ORAL_TABLET | Freq: Every day | ORAL | Status: DC
  Administered 2013-12-03 – 2013-12-04 (×2): 1 via ORAL
  Filled 2013-12-03 (×2): qty 1

## 2013-12-03 MED ORDER — DIPHENHYDRAMINE HCL 25 MG PO CAPS: 25.0000 mg | ORAL_CAPSULE | Freq: Four times a day (QID) | ORAL | Status: DC | PRN

## 2013-12-03 NOTE — MAU Note (Signed)
Patient states she is having contractions every 6-7 minutes with bloody show. Reports fetal movement.

## 2013-12-03 NOTE — H&P (Signed)
Attestation of Attending Supervision of Advanced Practitioner Student: Evaluation and management procedures were performed by the Advanced Practitioner student under my supervision and collaboration.  I have seenand examined this patient and reviewed the students note and chart, and I agree with the management and plan.  HARRAWAY-SMITH, Mariska Daffin 2:30 PM

## 2013-12-03 NOTE — Lactation Note (Signed)
This note was copied from the chart of Beverly Flynn. Lactation Consultation Note  Patient Name: Beverly Flynn ZOXWR'UToday's Date: 12/03/2013 Reason for consult: Initial assessment of this multipara who states she tried breastfeeding with 6 children for a few weels each but never experienced engorgement.  She does admit that she was not exclusively breastfeeding with other babies and is aware of small amounts of colostrum needed by newborn in early days of breastfeeding.  Her nurse has already provided the guidelines for supplement with formula, based on baby's day of life.  This and feeding/output logs are at bedside, in [place].  LC reviewed importance of early breastfeeding for milk supply and encouraged STS and cue feedings with no supplement unless medically indicated for at least 2 weeks.  LC encouraged review of Baby and Me pp 13-16 for review of BF information in [place].LC provided [employer] Resource brochure in Spanish, and reviewed WH services and list of community and web site resources..     Maternal Data Formula Feeding for Exclusion: Yes Reason for exclusion: Mother's choice to formula feed on admision Infant to breast within first hour of birth: Yes (baby just under 1 hour of age with first breastfeeding) Breastfeeding delayed due to:: Other (comment) (initially mom had stated formula feeding only) Has patient been taught Hand Expression?: Yes (mom informs LC that she knows how to hand express colostrum) Does the patient have breastfeeding experience prior to this delivery?: Yes  Feeding Feeding Type: Breast Fed Length of feed: 5 min  LATCH Score/Interventions         Initial LATCH score=9, per RN assessment             Lactation Tools Discussed/Used   STS, cue feedings, hand expression Supply and demand  Consult Status Consult Status: Follow-up Date: 12/04/13 Follow-up type: In-patient    Warrick ParisianBryant, Conlan [facility] 12/03/2013, 8:01 PM

## 2013-12-03 NOTE — MAU Note (Signed)
Pt in for routine labor check. Does not speak english.

## 2013-12-03 NOTE — H&P (Signed)
Beverly Flynn is a 38 y.o. [id number] female presenting for Active labor at term, SROM.  Maternal Medical History:  Reason for admission: Rupture of membranes, contractions and vaginal bleeding.   Contractions: Onset was 3-5 hours ago.   Frequency: regular.   Duration is approximately 30 seconds.   Perceived severity is strong.    Fetal activity: Perceived fetal activity is normal.   Last perceived fetal movement was within the past hour.    Prenatal complications: Cholelithiasis.   Prenatal Complications - Diabetes: none.    OB History   Grav Para Term Preterm Abortions TAB SAB Ect Mult Living   8 6 6  1  1   6      Past Medical History  Diagnosis Date  . No pertinent past medical history   . Medical history non-contributory    Past Surgical History  Procedure Laterality Date  . No past surgeries     Family History: family history is not on file. Social History:  reports that she has never smoked. She has never used smokeless tobacco. She reports that she does not drink alcohol or use illicit drugs.   Prenatal Transfer Tool  Maternal Diabetes: No Genetic Screening: Declined Maternal Ultrasounds/Referrals: Normal Fetal Ultrasounds or other Referrals:  None Maternal Substance Abuse:  No Significant Maternal Medications:  None Significant Maternal Lab Results:  None Other Comments:  None  Review of Systems  Constitutional: Negative.   HENT: Negative.   Eyes: Negative.   Respiratory: Negative.   Cardiovascular: Negative.   Gastrointestinal: Positive for abdominal pain.  Genitourinary: Negative.        Vaginal bleeding/Mucus Plug, SROM  Musculoskeletal: Negative.   Skin: Negative.   Neurological: Negative.   Endo/Heme/Allergies: Negative.   Psychiatric/Behavioral: Negative.     Dilation: 8 Effacement (%): 90 Station: 0 Exam by:: Dr. Erin Fulling  Blood pressure 111/88, pulse 79, temperature 98.2 F (36.8 C), temperature source Oral, resp. rate 20,  height 4\' 11"  (1.499 m), weight 80.287 kg (177 lb), last menstrual period 03/11/2013. Maternal Exam:  Uterine Assessment: Contraction strength is moderate.  Contraction duration is 30 seconds. Contraction frequency is regular.   Abdomen: Patient reports no abdominal tenderness. Fundal height is 39.   Estimated fetal weight is 8.5.   Fetal presentation: vertex  Introitus: Normal vulva. Normal vagina.    Fetal Exam Fetal Monitor Review: Mode: ultrasound.   Baseline rate: 140.  Variability: moderate (6-25 bpm).   Pattern: accelerations present and no decelerations.    Fetal State Assessment: Category I - tracings are normal.     Physical Exam  Constitutional: She is oriented to person, place, and time. She appears well-developed and well-nourished.  HENT:  Head: Normocephalic and atraumatic.  Eyes: Conjunctivae are normal.  Neck: Normal range of motion. Neck supple.  Cardiovascular: Normal rate and regular rhythm.   Respiratory: Effort normal and breath sounds normal.  GI: Soft.  Genitourinary: Vagina normal and uterus normal.  Musculoskeletal: Normal range of motion.  Neurological: She is alert and oriented to person, place, and time.  Skin: Skin is warm and dry.  Psychiatric: She has a normal mood and affect. Her behavior is normal. Thought content normal.    Prenatal labs: ABO, Rh:  Opos Antibody:  neg  Rubella:  imm RPR: Nonreactive (01/10 0000)  HBsAg:   neg HIV: Non-reactive (01/10 0000)  GBS: Negative (01/28 0000)  1 hr gluc 130  Assessment/Plan: IUP at 38.[redacted]week gestation Grandmultip Active labor at term SROM  Plan Admit to  birthing suites Anticipate NSVD    [name], [name] 12/03/2013, 9:47 AM  Evaluation and management procedures were performed by SNM under my supervision/collaboration. Chart reviewed, patient examined by me and I agree with management and plan. Had US yesterday for suspected macrosomia> EFW 9#1, AC>90th, fetal renal pyelectasis  resolved Dr. Erin FullingHarraway-Smith aware of LGA and grandmultiparity> plan cytotec at delivery.

## 2013-12-03 NOTE — Progress Notes (Signed)
UR completed 

## 2013-12-04 ENCOUNTER — Ambulatory Visit: Payer: Self-pay

## 2013-12-04 NOTE — Lactation Note (Signed)
This note was copied from the chart of Beverly Raynie Flynn. Lactation Consultation Note  Patient Name: Beverly Flynn [id number]'s Date: 12/04/2013 Reason for consult: Follow-up assessment of this mom and baby, 33 hours post-delivery.  Mom is grand multipara with previous breastfeeding experience but hx of low milk supply.  Baby had some borderline blood sugars and required some formula supplement but mom is primarily breastfeeding this baby.  Most recent LATCH score=8 but baby received a formula feeding 2 hours ago. Mom holding baby and LC discussed cue feedings and importance of always offering breast "on cue" and observing baby's output for signs that baby is receiving enough milk without supplement.     Maternal Data    Feeding Feeding Type: Bottle Fed - Formula  LATCH Score/Interventions         Most recent LATCH score=8, per RN assessment             Lactation Tools Discussed/Used   Cue feedings at breast Supply and demand  Consult Status Consult Status: Follow-up Date: 12/05/13 Follow-up type: In-patient    Warrick ParisianBryant, [name] [facility] 12/04/2013, 8:42 PM

## 2013-12-04 NOTE — Discharge Instructions (Signed)

## 2013-12-04 NOTE — Discharge Summary (Signed)
Obstetric Discharge Summary Reason for Admission: onset of labor Prenatal Procedures: none Intrapartum Procedures: spontaneous vaginal delivery Postpartum Procedures: none Complications-Operative and Postpartum: none Hemoglobin  Date Value Range Status  12/03/2013 10.3* 12.0 - 15.0 g/dL Final     HCT  Date Value Range Status  12/03/2013 32.6* 36.0 - 46.0 % Final    Hospital Course:  Pt was admitted for ROM on 12/03/2013 and rapidly progressed to [place] delivery. Pt meeting milestones postpartum. Meets discharge criteria. IUD and breastfeeding.  Delivery Note  End stage bradycardia 60's. Ritgens done. Nuchal cord reduced over head. McRoberts done with immediate restitution and delivery ensued. Cytotec 800 mcg pr given prophylactically Weak cry, poor tone. Cord pH done.  At 10:43 AM a viable female was delivered via Vaginal, Spontaneous Delivery (Presentation: Left Occiput Anterior). APGAR:4 ,9 ; weight pending.  Placenta status: intact 3VC, . Cord: with the following complications: long about 50 inches. Cord pH arterial: 7.09  Anesthesia: None  Episiotomy: None  Lacerations: None  Suture Repair: n/a  Est. Blood Loss (mL): 300;  Mom to postpartum. Baby to Nursery.  POE,DEIRDRE  12/03/2013, 10:56 AM    Physical Exam:  General: alert, cooperative, appears stated age and no distress Lochia: appropriate Uterine Fundus: firm Incision: na DVT Evaluation: No evidence of DVT seen on physical exam.  Discharge Diagnoses: Term Pregnancy-delivered  Discharge Information: Date: 12/04/2013 Activity: pelvic rest Diet: routine Medications: PNV Condition: stable Instructions: refer to practice specific booklet Discharge to: home   MOF: Breast MOC:  Desires IUD at f/u  Followup:  Health Department in 6 weeks to discuss IUD placement.  Newborn Data: Live born female  Birth Weight: 9 lb 5 oz (4224 g) APGAR: 3, 9  Home with mother.   Quincy SimmondsFeeney, Patricia L 12/04/2013, 9:28 AM  I  spoke with and examined patient and agree with resident's note and plan of care.  Tawana ScaleMichael Ryan Anwar Crill, MD OB Fellow 12/04/2013 1:56 PM

## 2014-09-07 ENCOUNTER — Encounter (HOSPITAL_COMMUNITY): Payer: Self-pay | Admitting: *Deleted

## 2015-06-10 IMAGING — US US OB COMP +14 WK
1 series · 12 of 28 positions shown · non-contrast
Comparison: none

[Series 1: us ob comp +14 wk · 12 of 74 slices shown]
[im 3/74]
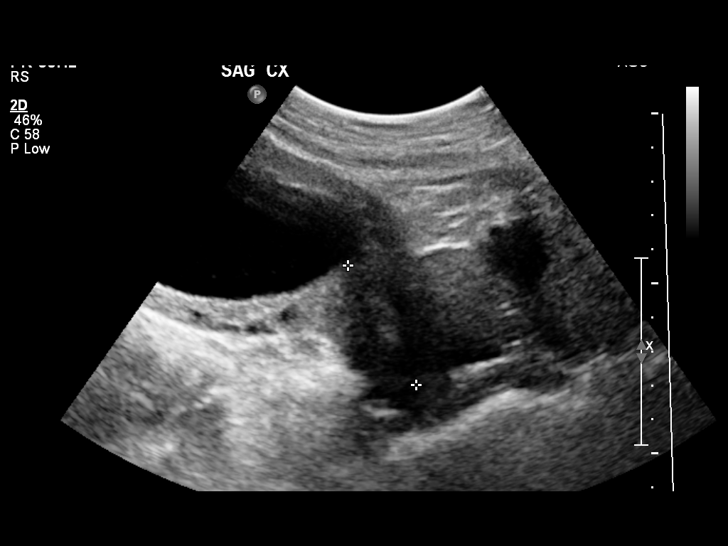
[im 9/74]
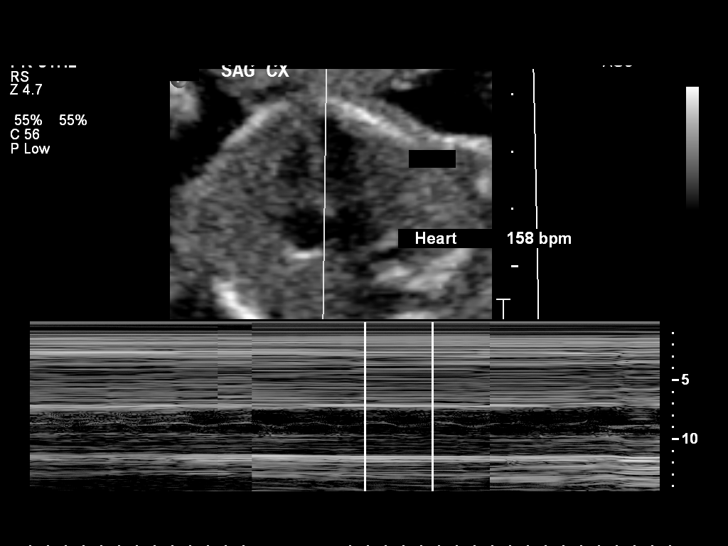
[im 14/74]
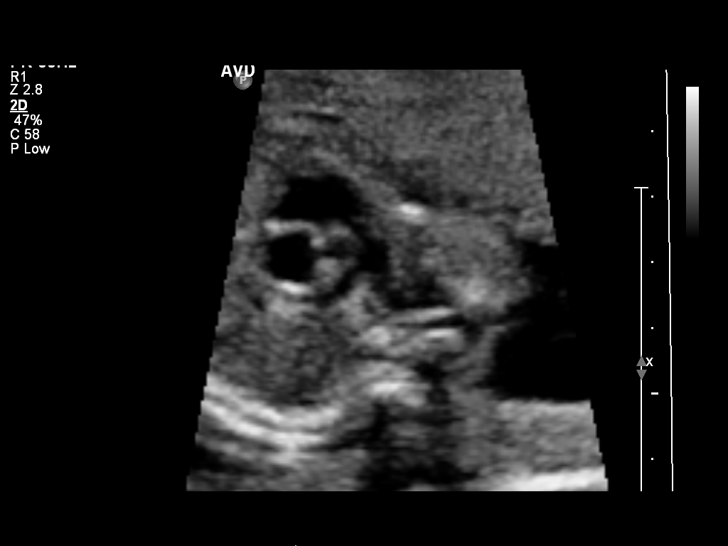
[im 22/74]
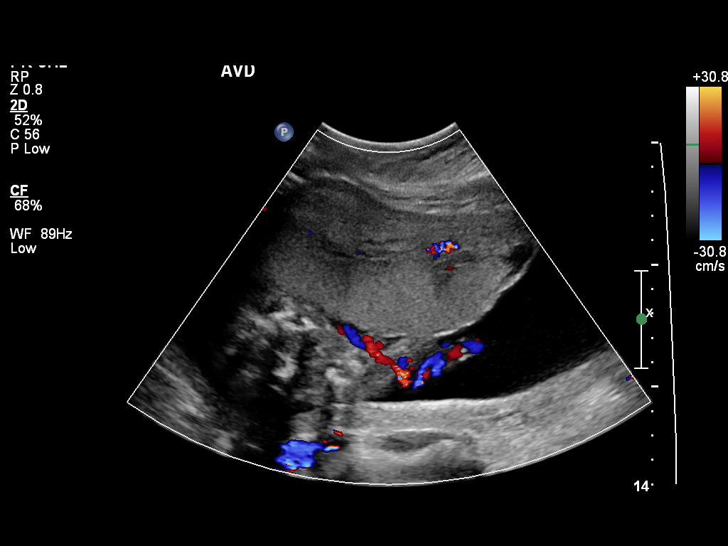
[im 28/74]
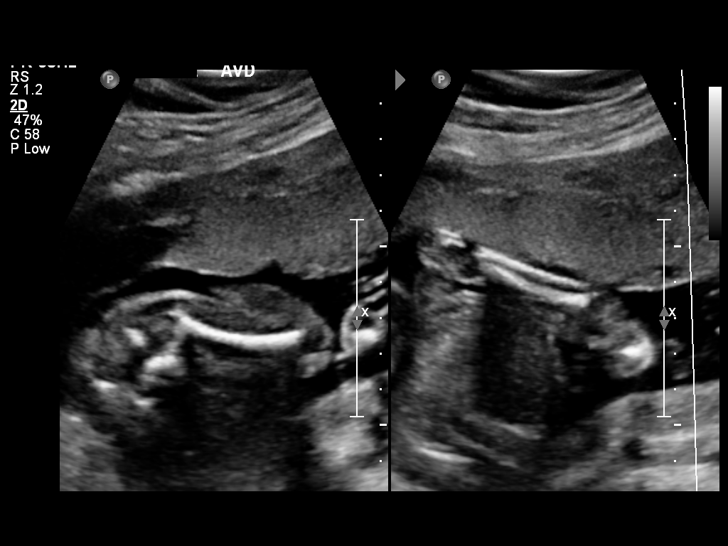
[im 33/74]
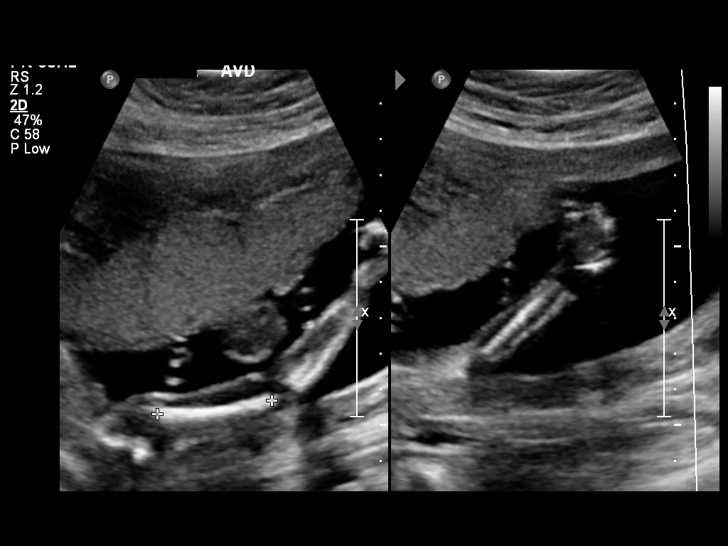
[im 41/74]
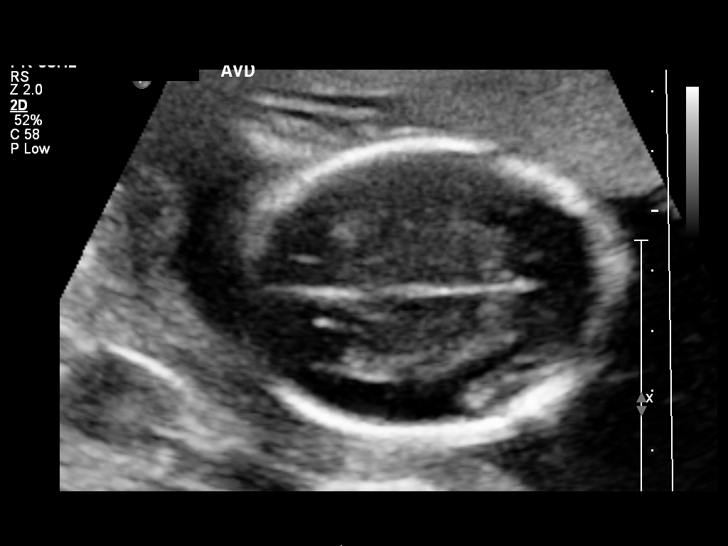
[im 46/74]
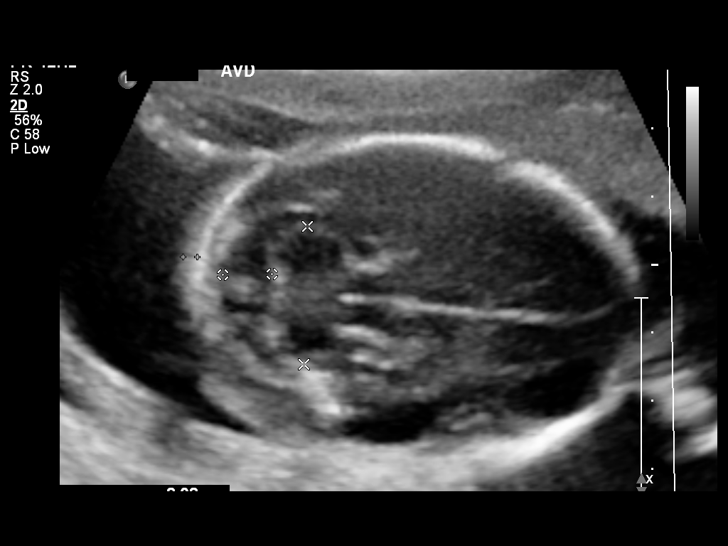
[im 52/74]
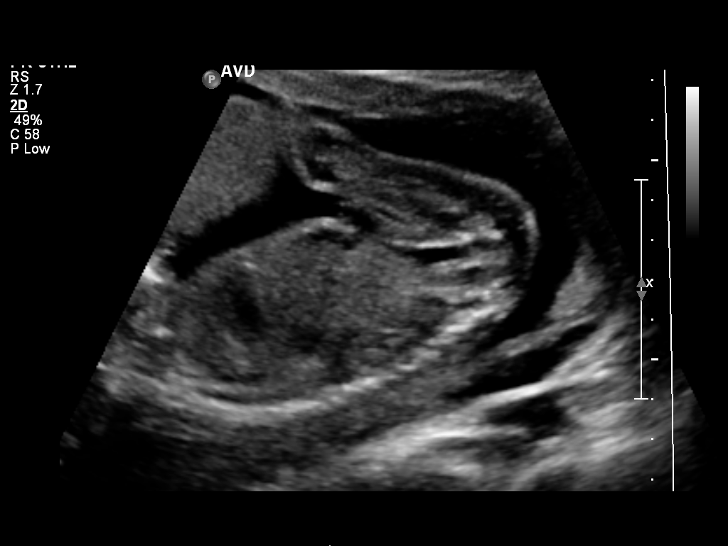
[im 60/74]
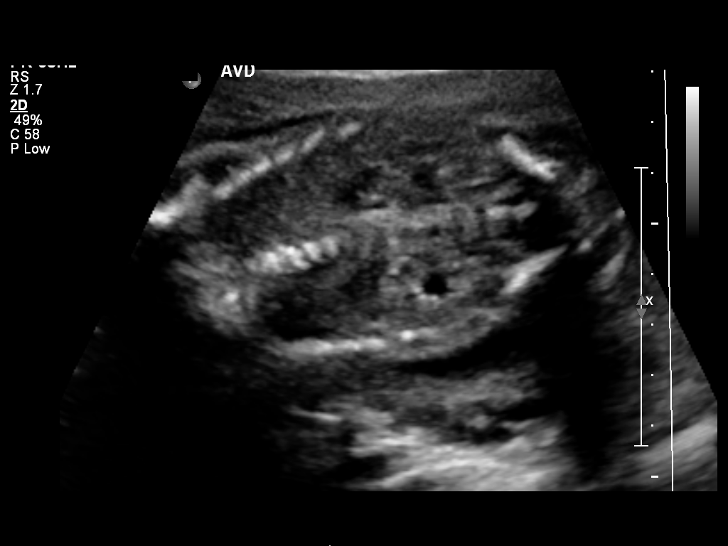
[im 65/74]
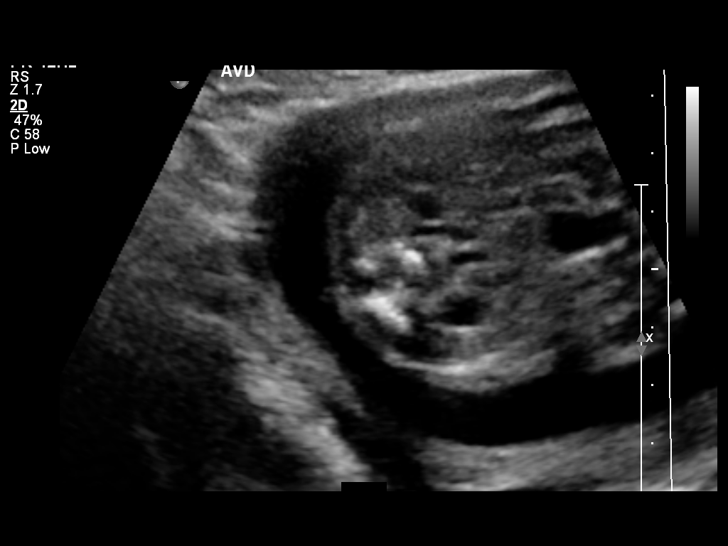
[im 71/74]
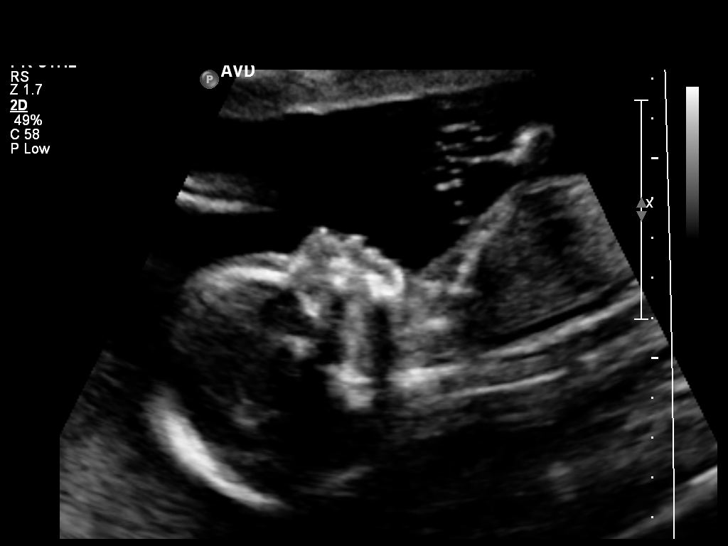

[12 of 28 positions shown; findings below may reference images not displayed]

OBSTETRICS REPORT
                      (Signed Final 07/23/2013 [DATE])

             PALLAG

                                                         N N SH
Service(s) Provided

 US OB COMP + 14 WK                                    76805.1
Indications

 Basic anatomic survey
 Advanced maternal age (AMA), Multigravida
Fetal Evaluation

 Num Of Fetuses:    1
 Fetal Heart Rate:  158                          bpm
 Cardiac Activity:  Observed
 Presentation:      Transverse, head to
                    maternal left
 Placenta:          Anterior, above cervical os
 P. Cord            Visualized, central
 Insertion:

 Amniotic Fluid
 AFI FV:      Subjectively within normal limits
                                             Larg Pckt:     5.4  cm
Biometry

 BPD:     49.1  mm     G. Age:  20w 6d                CI:         75.2   70 - 86
                                                      FL/HC:      18.7   16.8 -

 HC:     179.6  mm     G. Age:  20w 3d       67  %    HC/AC:      1.10   1.09 -

 AC:     163.9  mm     G. Age:  21w 3d       89  %    FL/BPD:
 FL:      33.5  mm     G. Age:  20w 4d       65  %    FL/AC:      20.4   20 - 24
 HUM:     32.8  mm     G. Age:  21w 0d       85  %
 CER:     19.8  mm     G. Age:  18w 6d       28  %
 NFT:     2.08  mm

 Est. FW:     389  gm    0 lb 14 oz      63  %
Gestational Age

 LMP:           19w 6d        Date:  03/06/13                 EDD:   12/11/13
 U/S Today:     20w 6d                                        EDD:   12/04/13
 Best:          19w 6d     Det. By:  LMP  (03/06/13)          EDD:   12/11/13
Anatomy

 Cranium:          Appears normal         Aortic Arch:      Basic anatomy
                                                            exam per order
 Fetal Cavum:      Appears normal         Ductal Arch:      Appears normal
 Ventricles:       Appears normal         Diaphragm:        Appears normal
 Choroid Plexus:   Appears normal         Stomach:          Appears normal
 Cerebellum:       Appears normal         Abdomen:          Appears normal
 Posterior Fossa:  Appears normal         Abdominal Wall:   Appears nml (cord
                                                            insert, abd wall)
 Nuchal Fold:      Appears normal         Cord Vessels:     Appears normal (3
                                                            vessel cord)
 Face:             Appears normal         Kidneys:          Bil pyelectasis Rt
                   (orbits and profile)
                                                            4.4mm, Lt 4.5mm
 Lips:             Appears normal         Bladder:          Appears normal
 Heart:            Appears normal         Spine:            Appears normal
                   (4CH, axis, and
                   situs)
 RVOT:             Appears normal         Lower             Appears normal
                                          Extremities:
 LVOT:             Appears normal         Upper             Appears normal
                                          Extremities:

 Other:  Fetus appears to be a male.
Targeted Anatomy

 Fetal Central Nervous System
 Cisterna Magna:
Comments

 Bilateral renal pylectasis (4.4 mm) was seen.  Although
 pylectasis may be associated with an increased risk of Down
 syndrome, this risk is felt to be minimal, especially when it is
 seen as an isolated finding.  It may also be a sign of a
 congenital anomaly of the kidneys and urinary tract,
 especially if the pylectasis worsens overtime.  If persistant or
 worsened at 32 weeks of gestation, a referral to Pediatric
 Urology should be considered.
Impression

 Single IUP at 20 [DATE] weeks
 Mild, bilateral renal pylectasis is noted
 Remainder of the fetal anatomy is within normal limits
 No other markers associated with aneuploidy are noted
 Normal amniotic fluid volume
Recommendations

 Would offer genetic counseling due to advanced maternal
 age.
 Recommend follow up ultrasound in the 3rd trimester (
 32
 weeks) to reevaluate the fetal kidneys.

## 2016-05-27 ENCOUNTER — Encounter (HOSPITAL_COMMUNITY): Payer: Self-pay | Admitting: *Deleted

## 2016-05-27 DIAGNOSIS — R1013 Epigastric pain: Secondary | ICD-10-CM | POA: Insufficient documentation

## 2016-05-27 LAB — CBC
HCT: 37 % (ref 36.0–46.0)
Hemoglobin: 11.8 g/dL — ABNORMAL LOW (ref 12.0–15.0)
MCH: 26.9 pg (ref 26.0–34.0)
MCHC: 31.9 g/dL (ref 30.0–36.0)
MCV: 84.5 fL (ref 78.0–100.0)
Platelets: 295 10*3/uL (ref 150–400)
RBC: 4.38 MIL/uL (ref 3.87–5.11)
RDW: 14.4 % (ref 11.5–15.5)
WBC: 11.1 10*3/uL — ABNORMAL HIGH (ref 4.0–10.5)

## 2016-05-27 LAB — COMPREHENSIVE METABOLIC PANEL
ALT: 17 U/L (ref 14–54)
AST: 15 U/L (ref 15–41)
Albumin: 3.7 g/dL (ref 3.5–5.0)
Alkaline Phosphatase: 81 U/L (ref 38–126)
Anion gap: 5 (ref 5–15)
BILIRUBIN TOTAL: 0.2 mg/dL — AB (ref 0.3–1.2)
BUN: 15 mg/dL (ref 6–20)
CO2: 27 mmol/L (ref 22–32)
Calcium: 9.3 mg/dL (ref 8.9–10.3)
Chloride: 105 mmol/L (ref 101–111)
Creatinine, Ser: 0.75 mg/dL (ref 0.44–1.00)
GFR calc non Af Amer: >60 mL/min (ref >60)
Glucose, Bld: 133 mg/dL — ABNORMAL HIGH (ref 65–99)
Potassium: 3.4 mmol/L — ABNORMAL LOW (ref 3.5–5.1)
Sodium: 137 mmol/L (ref 135–145)
TOTAL PROTEIN: 7.5 g/dL (ref 6.5–8.1)

## 2016-05-27 LAB — URINALYSIS, ROUTINE W REFLEX MICROSCOPIC
Bilirubin Urine: NEGATIVE
Glucose, UA: NEGATIVE mg/dL
KETONES UR: NEGATIVE mg/dL
Leukocytes, UA: NEGATIVE
NITRITE: NEGATIVE
PROTEIN: NEGATIVE mg/dL
Specific Gravity, Urine: 1.035 — ABNORMAL HIGH (ref 1.005–1.030)
pH: 5.5 (ref 5.0–8.0)

## 2016-05-27 LAB — URINE MICROSCOPIC-ADD ON

## 2016-05-27 LAB — POC URINE PREG, ED: PREG TEST UR: NEGATIVE

## 2016-05-27 LAB — LIPASE, BLOOD: Lipase: 28 U/L (ref 11–51)

## 2016-05-27 NOTE — ED Notes (Signed)
She is also c/o rt flank pain

## 2016-05-27 NOTE — ED Notes (Signed)
Upper abd pain with nv no diarrhea  No urinacry symptoms  ?? No temp  lmp this month

## 2016-05-28 ENCOUNTER — Emergency Department (HOSPITAL_COMMUNITY)
Admission: EM | Admit: 2016-05-28 | Discharge: 2016-05-28 | Disposition: A | Discharge status: Home / Self Care | Payer: Self-pay | Attending: Emergency Medicine | Admitting: Emergency Medicine

## 2016-05-28 DIAGNOSIS — R1013 Epigastric pain: Secondary | ICD-10-CM

## 2016-05-28 MED ORDER — MORPHINE SULFATE (PF) 4 MG/ML IV SOLN
INTRAVENOUS | Status: AC
  Filled 2016-05-28: qty 1

## 2016-05-28 MED ORDER — GI COCKTAIL ~~LOC~~
ORAL | Status: AC
  Filled 2016-05-28: qty 30

## 2016-05-28 MED ORDER — ONDANSETRON HCL 4 MG/2ML IJ SOLN
INTRAMUSCULAR | Status: AC
  Filled 2016-05-28: qty 2

## 2016-05-28 NOTE — ED Provider Notes (Signed)
[facility]EMERGENCY DEPT Provider Note   CSN: [id number] Arrival date & time: 05/27/16  2021  First Provider Contact: 3:00AM  History   Chief Complaint Chief Complaint  Patient presents with  . Abdominal Pain   The history is provided by the patient. No language interpreter was used.  Abdominal Pain   This is a recurrent problem. The current episode started more than 2 days ago. The problem has been resolved. The pain is associated with eating. The pain is located in the RUQ. The quality of the pain is burning. The patient is experiencing no pain. Associated symptoms include nausea and vomiting. Pertinent negatives include fever, dysuria, headaches, arthralgias and myalgias.    HPI Comments: Beverly Flynn is a 40 y.o. female with no pertinent PMHx, who presents to the Emergency Department complaining of gradual onset, resolved, intermittent, right upper abdominal pain x 5 days. Associated symptoms include right sided back pain, nausea, and vomiting. She describes her pain as burning in her stomach, and pressure-like in her back. She denies being in any pain while in the ED. Pt states that this pain has been present for intermittent periods since she last gave birth ~2 years ago. She notes that her pain is worsened with eating spicy foods or foods high in fat. Pt reports that she has tried to stop eating spicy foods and her pain will continue to persist. She has been taking Indomethacin for her pain that she was prescribed by her PCP. Pt notes that when she takes her medication that her pain will worsen. She denies fevers.   Past Medical History:  Diagnosis Date  . Medical history non-contributory   . No pertinent past medical history     Patient Active Problem List   Diagnosis Date Noted  . Active labor at term 12/03/2013  . Active labor 12/03/2013    Past Surgical History:  Procedure Laterality Date  . NO PAST SURGERIES      OB History    Gravida Para Term Preterm AB Living     8 7 7   1 7    SAB TAB Ectopic Multiple Live Births   1               Home Medications    Prior to Admission medications   Medication Sig Start Date End Date Taking? Authorizing Provider  Prenatal Vit-Fe Fumarate-FA (PRENATAL MULTIVITAMIN) TABS Take 1 tablet by mouth daily.    Historical Provider, MD    Family History No family history on file.  Social History Social History  Substance Use Topics  . Smoking status: Never Smoker  . Smokeless tobacco: Never Used  . Alcohol use No     Allergies   Review of patient's allergies indicates no known allergies.   Review of Systems Review of Systems  Constitutional: Negative for chills and fever.  HENT: Negative for congestion and rhinorrhea.   Eyes: Negative for redness and visual disturbance.  Respiratory: Negative for shortness of breath and wheezing.   Cardiovascular: Negative for chest pain and palpitations.  Gastrointestinal: Positive for abdominal pain, nausea and vomiting.  Genitourinary: Negative for dysuria and urgency.  Musculoskeletal: Positive for back pain. Negative for arthralgias and myalgias.  Skin: Negative for pallor and wound.  Neurological: Negative for dizziness and headaches.  All other systems reviewed and are negative.  Physical Exam Updated Vital Signs BP (!) 123/106 (BP Location: Left Arm)   Pulse 91   Temp 98.3 F (36.8 C) (Oral)   Resp 18  Wt 172 lb 7 oz (78.2 kg)   LMP 04/27/2016   SpO2 99%   BMI 34.83 kg/m   Physical Exam  Constitutional: She appears well-developed and well-nourished. No distress.  HENT:  Head: Normocephalic and atraumatic.  Eyes: EOM are normal.  Neck: Normal range of motion.  Cardiovascular: Normal rate, regular rhythm and normal heart sounds.   No murmur heard. Pulmonary/Chest: Effort normal. No respiratory distress. She has no wheezes. She has no rales.  Abdominal: Soft. She exhibits no distension and no mass. There is tenderness. There is no rebound and no  guarding.  Tenderness worse in the epigastric and RUQ regions. She exhibits positive Murphey's sign.  Musculoskeletal: Normal range of motion.  Neurological: She is alert.  Skin: Skin is warm and dry. Capillary refill takes less than 2 seconds. No rash noted. No erythema.  Psychiatric: She has a normal mood and affect. Judgment normal.  Nursing note and vitals reviewed.    ED Treatments / Results  Labs (all labs ordered are listed, but only abnormal results are displayed) Labs Reviewed  COMPREHENSIVE METABOLIC PANEL - Abnormal; Notable for the following:       Result Value   Potassium 3.4 (*)    Glucose, Bld 133 (*)    Total Bilirubin 0.2 (*)    All other components within normal limits  CBC - Abnormal; Notable for the following:    WBC 11.1 (*)    Hemoglobin 11.8 (*)    All other components within normal limits  URINALYSIS, ROUTINE W REFLEX MICROSCOPIC (NOT AT [facility]) - Abnormal; Notable for the following:    Specific Gravity, Urine 1.035 (*)    Hgb urine dipstick TRACE (*)    All other components within normal limits  URINE MICROSCOPIC-ADD ON - Abnormal; Notable for the following:    Squamous Epithelial / LPF 0-5 (*)    Bacteria, UA RARE (*)    All other components within normal limits  LIPASE, BLOOD  POC URINE PREG, ED    EKG  EKG Interpretation None       Radiology No results found.  Procedures Procedures (including critical care time)  Medications Ordered in ED Medications - No data to display   Initial Impression / Assessment and Plan / ED Course  I have reviewed the triage vital signs and the nursing notes.  Pertinent labs & imaging results that were available during my care of the patient were reviewed by me and considered in my medical decision making (see chart for details).  Clinical Course    40 yo F with recurrent epigastric and RUQ abdominal pain post eating.  Reflux vs cholelithasis by hx.  Afebrile no elevated WBC and no pain on my exam.   Feel unlikely to be cholecystitis and see no need for imaging at this time.  PCP and general surgery follow up.    I have discussed the diagnosis/risks/treatment options with the patient and family and believe the pt to be eligible for discharge home to follow-up with PCP/ Gen surgery. We also discussed returning to the ED immediately if new or worsening sx occur. We discussed the sx which are most concerning (e.g., sudden worsening pain, fever, inability to tolerate by mouth) that necessitate immediate return. Medications administered to the patient during their visit and any new prescriptions provided to the patient are listed below.  Medications given during this visit Medications - No data to display  Discharge Medication List as of 05/28/2016  5:28 AM  The patient appears reasonably screen and/or stabilized for discharge and I doubt any other medical condition or other [facility] requiring further screening, evaluation, or treatment in the ED at this time prior to discharge.    Final Clinical Impressions(s) / ED Diagnoses   Final diagnoses:  Epigastric abdominal pain    New Prescriptions Discharge Medication List as of 05/28/2016  5:28 AM       Melene Plan, DO 05/28/16 1448

## 2016-05-28 NOTE — ED Notes (Signed)
See Downtime chart 

## 2016-08-24 ENCOUNTER — Ambulatory Visit: Payer: Self-pay | Admitting: Internal Medicine

## 2016-10-21 ENCOUNTER — Emergency Department (HOSPITAL_COMMUNITY): Payer: Self-pay

## 2016-10-21 ENCOUNTER — Encounter (HOSPITAL_COMMUNITY): Payer: Self-pay | Admitting: *Deleted

## 2016-10-21 ENCOUNTER — Emergency Department (HOSPITAL_COMMUNITY)
Admission: EM | Admit: 2016-10-21 | Discharge: 2016-10-22 | Disposition: A | Discharge status: Home / Self Care | Payer: Self-pay | Attending: Emergency Medicine | Admitting: Emergency Medicine

## 2016-10-21 DIAGNOSIS — R52 Pain, unspecified: Secondary | ICD-10-CM

## 2016-10-21 DIAGNOSIS — Z3490 Encounter for supervision of normal pregnancy, unspecified, unspecified trimester: Secondary | ICD-10-CM

## 2016-10-21 DIAGNOSIS — K8020 Calculus of gallbladder without cholecystitis without obstruction: Secondary | ICD-10-CM | POA: Insufficient documentation

## 2016-10-21 DIAGNOSIS — O26899 Other specified pregnancy related conditions, unspecified trimester: Secondary | ICD-10-CM | POA: Insufficient documentation

## 2016-10-21 DIAGNOSIS — O0281 Inappropriate change in quantitative human chorionic gonadotropin (hCG) in early pregnancy: Secondary | ICD-10-CM | POA: Insufficient documentation

## 2016-10-21 DIAGNOSIS — Z3A00 Weeks of gestation of pregnancy not specified: Secondary | ICD-10-CM | POA: Insufficient documentation

## 2016-10-21 LAB — CBC
HCT: 34.8 % — ABNORMAL LOW (ref 36.0–46.0)
Hemoglobin: 11.4 g/dL — ABNORMAL LOW (ref 12.0–15.0)
MCH: 27.2 pg (ref 26.0–34.0)
MCHC: 32.8 g/dL (ref 30.0–36.0)
MCV: 83.1 fL (ref 78.0–100.0)
PLATELETS: 307 10*3/uL (ref 150–400)
RBC: 4.19 MIL/uL (ref 3.87–5.11)
RDW: 14.7 % (ref 11.5–15.5)
WBC: 11.9 10*3/uL — AB (ref 4.0–10.5)

## 2016-10-21 LAB — URINALYSIS, ROUTINE W REFLEX MICROSCOPIC
BILIRUBIN URINE: NEGATIVE
Glucose, UA: NEGATIVE mg/dL
KETONES UR: NEGATIVE mg/dL
NITRITE: NEGATIVE
PROTEIN: NEGATIVE mg/dL
Specific Gravity, Urine: 1.02 (ref 1.005–1.030)
pH: 6 (ref 5.0–8.0)

## 2016-10-21 LAB — COMPREHENSIVE METABOLIC PANEL
ALBUMIN: 3.4 g/dL — AB (ref 3.5–5.0)
ALK PHOS: 66 U/L (ref 38–126)
ALT: 19 U/L (ref 14–54)
AST: 16 U/L (ref 15–41)
Anion gap: 6 (ref 5–15)
BUN: 10 mg/dL (ref 6–20)
CALCIUM: 9.4 mg/dL (ref 8.9–10.3)
CO2: 25 mmol/L (ref 22–32)
Chloride: 104 mmol/L (ref 101–111)
Creatinine, Ser: 0.59 mg/dL (ref 0.44–1.00)
GFR calc Af Amer: >60 mL/min (ref >60)
GFR calc non Af Amer: >60 mL/min (ref >60)
GLUCOSE: 100 mg/dL — AB (ref 65–99)
Potassium: 3.4 mmol/L — ABNORMAL LOW (ref 3.5–5.1)
Sodium: 135 mmol/L (ref 135–145)
TOTAL PROTEIN: 7.5 g/dL (ref 6.5–8.1)
Total Bilirubin: 0.4 mg/dL (ref 0.3–1.2)

## 2016-10-21 LAB — URINALYSIS, MICROSCOPIC (REFLEX)

## 2016-10-21 LAB — HCG, QUANTITATIVE, PREGNANCY: hCG, Beta Chain, Quant, S: 76996 m[IU]/mL — ABNORMAL HIGH (ref <5)

## 2016-10-21 LAB — POC URINE PREG, ED: PREG TEST UR: POSITIVE — AB

## 2016-10-21 LAB — LIPASE, BLOOD: Lipase: 23 U/L (ref 11–51)

## 2016-10-21 MED ORDER — SODIUM CHLORIDE 0.9 % IV BOLUS (SEPSIS)
1000.0000 mL | Freq: Once | INTRAVENOUS | Status: AC
  Administered 2016-10-21: 1000 mL via INTRAVENOUS

## 2016-10-21 MED ORDER — ONDANSETRON HCL 4 MG/2ML IJ SOLN
4.0000 mg | Freq: Once | INTRAMUSCULAR | Status: AC
  Administered 2016-10-21: 4 mg via INTRAVENOUS
  Filled 2016-10-21: qty 2

## 2016-10-21 MED ORDER — MORPHINE SULFATE (PF) 4 MG/ML IV SOLN
4.0000 mg | Freq: Once | INTRAVENOUS | Status: AC
  Administered 2016-10-21: 4 mg via INTRAVENOUS
  Filled 2016-10-21: qty 1

## 2016-10-21 NOTE — ED Notes (Signed)
Patient transported to Ultrasound 

## 2016-10-21 NOTE — ED Notes (Signed)
Ipad interpreter and ED Provider at bedside.

## 2016-10-21 NOTE — ED Provider Notes (Signed)
[facility] Provider Note   CSN: [id number] Arrival date & time: 10/21/16  1931     History   Chief Complaint Chief Complaint  Patient presents with  . Abdominal Pain    HPI Beverly Flynn is a 40 y.o. female.  Pt presents to the ED today with RUQ abdominal pain.  The pt speaks primarily Spanish and information is obtained via interpreter.  Pt said that she has had gallstones for years.  She was scheduled for a cholecystectomy on 11/30.  She was in the OR and ready to go, then her urine pregnancy test came back positive and the procedure was cancelled.  There has been some confusion likely due to a language barrier.  There is a note in the computer that she was called for a post op visit, but she never had the procedure.  She is here today because the pain is severe after she eats.  She was taking hydrocodone for her sx, but is out.  Pt denies any fevers or chills.      Past Medical History:  Diagnosis Date  . Medical history non-contributory   . No pertinent past medical history     Patient Active Problem List   Diagnosis Date Noted  . Active labor at term 12/03/2013  . Active labor 12/03/2013    Past Surgical History:  Procedure Laterality Date  . NO PAST SURGERIES      OB History    Gravida Para Term Preterm AB Living   8 7 7   1 7    SAB TAB Ectopic Multiple Live Births   1       2       Home Medications    Prior to Admission medications   Medication Sig Start Date End Date Taking? Authorizing Provider  HYDROcodone-acetaminophen (NORCO/VICODIN) 5-325 MG tablet Take 1 tablet by mouth every 4 (four) hours as needed. 10/22/16   Jacalyn Lefevre, MD  ondansetron (ZOFRAN ODT) 4 MG disintegrating tablet Take 1 tablet (4 mg total) by mouth every 8 (eight) hours as needed for nausea or vomiting. 10/22/16   Jacalyn Lefevre, MD  Prenatal Vit-Fe Fumarate-FA (PRENATAL MULTIVITAMIN) TABS Take 1 tablet by mouth daily.    Historical Provider, MD    Family  History No family history on file.  Social History Social History  Substance Use Topics  . Smoking status: Never Smoker  . Smokeless tobacco: Never Used  . Alcohol use No     Allergies   Patient has no known allergies.   Review of Systems Review of Systems  Gastrointestinal: Positive for abdominal pain, nausea and vomiting.  All other systems reviewed and are negative.    Physical Exam Updated Vital Signs BP 106/55   Pulse 86   Temp 98.5 F (36.9 C) (Oral)   Resp 18   SpO2 100%   Physical Exam  Constitutional: She is oriented to person, place, and time. She appears well-developed and well-nourished.  HENT:  Head: Normocephalic and atraumatic.  Right Ear: External ear normal.  Left Ear: External ear normal.  Nose: Nose normal.  Mouth/Throat: Oropharynx is clear and moist.  Eyes: Conjunctivae and EOM are normal. Pupils are equal, round, and reactive to light.  Neck: Normal range of motion. Neck supple.  Cardiovascular: Normal rate, regular rhythm, normal heart sounds and intact distal pulses.   Pulmonary/Chest: Effort normal and breath sounds normal.  Abdominal: Soft. Bowel sounds are normal. There is tenderness in the right upper quadrant.  Musculoskeletal: Normal range  of motion.  Neurological: She is alert and oriented to person, place, and time.  Skin: Skin is warm.  Psychiatric: She has a normal mood and affect. Her behavior is normal. Judgment and thought content normal.  Nursing note and vitals reviewed.    ED Treatments / Results  Labs (all labs ordered are listed, but only abnormal results are displayed) Labs Reviewed  COMPREHENSIVE METABOLIC PANEL - Abnormal; Notable for the following:       Result Value   Potassium 3.4 (*)    Glucose, Bld 100 (*)    Albumin 3.4 (*)    All other components within normal limits  CBC - Abnormal; Notable for the following:    WBC 11.9 (*)    Hemoglobin 11.4 (*)    HCT 34.8 (*)    All other components within  normal limits  URINALYSIS, ROUTINE W REFLEX MICROSCOPIC - Abnormal; Notable for the following:    Hgb urine dipstick SMALL (*)    Leukocytes, UA TRACE (*)    All other components within normal limits  URINALYSIS, MICROSCOPIC (REFLEX) - Abnormal; Notable for the following:    Bacteria, UA RARE (*)    Squamous Epithelial / LPF 0-5 (*)    All other components within normal limits  HCG, QUANTITATIVE, PREGNANCY - Abnormal; Notable for the following:    hCG, Beta Chain, Quant, S [id number] (*)    All other components within normal limits  POC URINE PREG, ED - Abnormal; Notable for the following:    Preg Test, Ur POSITIVE (*)    All other components within normal limits  LIPASE, BLOOD    EKG  EKG Interpretation None       Radiology [place] Abdomen Limited Ruq  Result Date: 10/21/2016 CLINICAL DATA:  Initial evaluation for acute right upper quadrant/ epigastric pain. EXAM: US ABDOMEN LIMITED - RIGHT UPPER QUADRANT COMPARISON:  None available. FINDINGS: Gallbladder: Multiple echogenic stones present. Gallbladder wall measured at the upper limits of normal at 3.6 mm. No sonographic Murphy sign elicited on exam. No free pericholecystic fluid. Common bile duct: Diameter: 1.8 mm. Liver: No focal lesion identified.  Increased echogenicity. IMPRESSION: 1. Cholelithiasis without additional sonographic features to suggest acute cholecystitis. No biliary dilatation. 2. Increased hepatic echogenicity, suggestive of steatosis. Electronically Signed   By: Rise MuBenjamin  McClintock M.D.   On: 10/21/2016 23:52    Procedures Procedures (including critical care time)  Medications Ordered in ED Medications  sodium chloride 0.9 % bolus 1,000 mL (1,000 mLs Intravenous New Bag/Given 10/21/16 2226)  ondansetron (ZOFRAN) injection 4 mg (4 mg Intravenous Given 10/21/16 2222)  morphine 4 MG/ML injection 4 mg (4 mg Intravenous Given 10/21/16 2224)     Initial Impression / Assessment and Plan / ED Course  I have reviewed  the triage vital signs and the nursing notes.  Pertinent labs & imaging results that were available during my care of the patient were reviewed by me and considered in my medical decision making (see chart for details).  Clinical Course     Bedside us of uterus shows an IUP.  Pt has no lower abdominal pain.  GB us today shows stones, but no evidence of infection.  The pt's pain is improved.  She is given the number to surgery to f/u.  She knows to return if worse.  Final Clinical Impressions(s) / ED Diagnoses   Final diagnoses:  Pain  Calculus of gallbladder without cholecystitis without obstruction  Intrauterine pregnancy    New Prescriptions New Prescriptions  HYDROCODONE-ACETAMINOPHEN (NORCO/VICODIN) 5-325 MG TABLET    Take 1 tablet by mouth every 4 (four) hours as needed.   ONDANSETRON (ZOFRAN ODT) 4 MG DISINTEGRATING TABLET    Take 1 tablet (4 mg total) by mouth every 8 (eight) hours as needed for nausea or vomiting.     Jacalyn LefevreJulie Meghana Tullo, MD 10/22/16 [phone]

## 2016-10-21 NOTE — ED Triage Notes (Signed)
The pt is c/o abd pain for 1-2 days  lmp October  Nausea no urinary symptoms

## 2016-10-22 MED ORDER — ONDANSETRON 4 MG PO TBDP: 4.0000 mg | ORAL_TABLET | Freq: Three times a day (TID) | ORAL | 0 refills | Status: DC | PRN

## 2016-10-22 MED ORDER — HYDROCODONE-ACETAMINOPHEN 5-325 MG PO TABS: 1.0000 | ORAL_TABLET | ORAL | 0 refills | Status: DC | PRN

## 2016-10-22 NOTE — ED Notes (Signed)
Pt dc ambulatory instructed on follow up plan and med use PT NAD at DC. Stratus used to interpret

## 2016-11-06 NOTE — L&D Delivery Note (Signed)
Delivery Note At 2:12 AM a viable female was delivered via Vaginal, Spontaneous Delivery (Presentation: ).  APGAR: 9/9; weight 8 lbs 6.2 oz.  Nuchal/body cord easily delivered body through.  Placenta status: spontaneous, intact via Tomasa BlaseSchultz.  Cord: 3VC with no complications.    Anesthesia: none Episiotomy: none Lacerations: none  Suture Repair: N/A Est. Blood Loss (mL): 10  Mom to postpartum.  Baby to Couplet care / Skin to Skin.  Raelyn Moraolitta Azalie Harbeck, MSN, CNM 05/28/2017, 2:20 AM

## 2016-12-04 ENCOUNTER — Other Ambulatory Visit: Payer: Self-pay | Admitting: Obstetrics & Gynecology

## 2016-12-04 DIAGNOSIS — Z369 Encounter for antenatal screening, unspecified: Secondary | ICD-10-CM

## 2016-12-06 ENCOUNTER — Ambulatory Visit (HOSPITAL_COMMUNITY)
Admission: RE | Admit: 2016-12-06 | Discharge: 2016-12-06 | Disposition: A | Discharge status: Home / Self Care | Payer: Self-pay | Source: Ambulatory Visit | Attending: Obstetrics & Gynecology | Admitting: Obstetrics & Gynecology

## 2016-12-06 ENCOUNTER — Other Ambulatory Visit: Payer: Self-pay | Admitting: Obstetrics & Gynecology

## 2016-12-06 ENCOUNTER — Encounter (HOSPITAL_COMMUNITY): Payer: Self-pay

## 2016-12-06 ENCOUNTER — Ambulatory Visit (HOSPITAL_COMMUNITY): Admission: RE | Admit: 2016-12-06 | Payer: Self-pay | Source: Ambulatory Visit

## 2016-12-06 DIAGNOSIS — O09521 Supervision of elderly multigravida, first trimester: Secondary | ICD-10-CM

## 2016-12-06 DIAGNOSIS — Z3682 Encounter for antenatal screening for nuchal translucency: Secondary | ICD-10-CM

## 2016-12-06 DIAGNOSIS — Z369 Encounter for antenatal screening, unspecified: Secondary | ICD-10-CM

## 2016-12-06 DIAGNOSIS — Z3A13 13 weeks gestation of pregnancy: Secondary | ICD-10-CM | POA: Insufficient documentation

## 2016-12-06 DIAGNOSIS — E669 Obesity, unspecified: Secondary | ICD-10-CM

## 2016-12-06 DIAGNOSIS — O99211 Obesity complicating pregnancy, first trimester: Secondary | ICD-10-CM | POA: Insufficient documentation

## 2016-12-16 ENCOUNTER — Inpatient Hospital Stay (HOSPITAL_COMMUNITY)
Admission: AD | Admit: 2016-12-16 | Discharge: 2016-12-16 | Disposition: A | Discharge status: Home / Self Care | Payer: Medicaid Other | Source: Ambulatory Visit | Attending: Obstetrics & Gynecology | Admitting: Obstetrics & Gynecology

## 2016-12-16 ENCOUNTER — Encounter (HOSPITAL_COMMUNITY): Payer: Self-pay

## 2016-12-16 DIAGNOSIS — K8000 Calculus of gallbladder with acute cholecystitis without obstruction: Secondary | ICD-10-CM | POA: Diagnosis not present

## 2016-12-16 DIAGNOSIS — Z8719 Personal history of other diseases of the digestive system: Secondary | ICD-10-CM | POA: Diagnosis not present

## 2016-12-16 DIAGNOSIS — R1011 Right upper quadrant pain: Secondary | ICD-10-CM | POA: Insufficient documentation

## 2016-12-16 HISTORY — DX: Calculus of gallbladder without cholecystitis without obstruction: K80.20

## 2016-12-16 LAB — URINALYSIS, ROUTINE W REFLEX MICROSCOPIC
Bilirubin Urine: NEGATIVE
GLUCOSE, UA: NEGATIVE mg/dL
HGB URINE DIPSTICK: NEGATIVE
KETONES UR: NEGATIVE mg/dL
LEUKOCYTES UA: NEGATIVE
Nitrite: NEGATIVE
PROTEIN: NEGATIVE mg/dL
Specific Gravity, Urine: 1.016 (ref 1.005–1.030)
pH: 7 (ref 5.0–8.0)

## 2016-12-16 LAB — COMPREHENSIVE METABOLIC PANEL
ALBUMIN: 3.4 g/dL — AB (ref 3.5–5.0)
ALT: 14 U/L (ref 14–54)
AST: 13 U/L — AB (ref 15–41)
Alkaline Phosphatase: 58 U/L (ref 38–126)
Anion gap: 4 — ABNORMAL LOW (ref 5–15)
BUN: 11 mg/dL (ref 6–20)
CHLORIDE: 107 mmol/L (ref 101–111)
CO2: 26 mmol/L (ref 22–32)
CREATININE: 0.53 mg/dL (ref 0.44–1.00)
Calcium: 8.8 mg/dL — ABNORMAL LOW (ref 8.9–10.3)
GFR calc Af Amer: >60 mL/min (ref >60)
GLUCOSE: 90 mg/dL (ref 65–99)
POTASSIUM: 3.8 mmol/L (ref 3.5–5.1)
Sodium: 137 mmol/L (ref 135–145)
Total Bilirubin: 0.2 mg/dL — ABNORMAL LOW (ref 0.3–1.2)
Total Protein: 6.7 g/dL (ref 6.5–8.1)

## 2016-12-16 LAB — CBC WITH DIFFERENTIAL/PLATELET
BASOS ABS: 0 10*3/uL (ref 0.0–0.1)
BASOS PCT: 0 %
EOS ABS: 0.2 10*3/uL (ref 0.0–0.7)
EOS PCT: 1 %
HCT: 32 % — ABNORMAL LOW (ref 36.0–46.0)
Hemoglobin: 10.8 g/dL — ABNORMAL LOW (ref 12.0–15.0)
LYMPHS PCT: 24 %
Lymphs Abs: 2.5 10*3/uL (ref 0.7–4.0)
MCH: 28 pg (ref 26.0–34.0)
MCHC: 33.8 g/dL (ref 30.0–36.0)
MCV: 82.9 fL (ref 78.0–100.0)
MONO ABS: 0.4 10*3/uL (ref 0.1–1.0)
Monocytes Relative: 4 %
Neutro Abs: 7.4 10*3/uL (ref 1.7–7.7)
Neutrophils Relative %: 71 %
PLATELETS: 249 10*3/uL (ref 150–400)
RBC: 3.86 MIL/uL — AB (ref 3.87–5.11)
RDW: 15.5 % (ref 11.5–15.5)
WBC: 10.5 10*3/uL (ref 4.0–10.5)

## 2016-12-16 LAB — AMYLASE: Amylase: 95 U/L (ref 28–100)

## 2016-12-16 LAB — LIPASE, BLOOD: LIPASE: 26 U/L (ref 11–51)

## 2016-12-16 MED ORDER — HYDROXYZINE HCL 50 MG/ML IM SOLN
50.0000 mg | Freq: Once | INTRAMUSCULAR | Status: DC
  Filled 2016-12-16: qty 1

## 2016-12-16 MED ORDER — HYDROXYZINE PAMOATE 50 MG PO CAPS: 50.0000 mg | ORAL_CAPSULE | Freq: Three times a day (TID) | ORAL | 0 refills | Status: DC | PRN

## 2016-12-16 MED ORDER — ONDANSETRON 4 MG PO TBDP
4.0000 mg | ORAL_TABLET | Freq: Once | ORAL | Status: AC
  Administered 2016-12-16: 4 mg via ORAL
  Filled 2016-12-16: qty 1

## 2016-12-16 MED ORDER — BUTALBITAL-APAP-CAFFEINE 50-325-40 MG PO TABS
2.0000 | ORAL_TABLET | Freq: Once | ORAL | Status: AC
  Administered 2016-12-16: 2 via ORAL
  Filled 2016-12-16: qty 2

## 2016-12-16 MED ORDER — BUTALBITAL-APAP-CAFFEINE 50-325-40 MG PO TABS: 1.0000 | ORAL_TABLET | Freq: Four times a day (QID) | ORAL | 0 refills | Status: DC | PRN

## 2016-12-16 NOTE — Discharge Instructions (Signed)
Colelitiasis  (Cholelithiasis)  La colelitiasis (tambin llamada clculos en la vescula) es una enfermedad de la vescula. La vescula es un rgano pequeo que ayuda a digerir las grasas. Los sntomas de clculos en la vescula son:   Ganas de vomitar (nuseas).   Devolver la comida (vomitar).   Dolor en el vientre.   Piel amarilla (ictericia)   Dolor sbito. Podr sentir el dolor durante algunos minutos o unas horas.   Fiebre.   Dolor a la palpacin.  CUIDADOS EN EL HOGAR   Tome slo los medicamentos segn le haya indicado el mdico.   Siga una dieta baja en grasas hasta que vuelva a ver al mdico nuevamente. Consumir grasas puede causar dolor.   Concurra a las consultas de control con el mdico, segn las indicaciones. Los ataques generalmente ocurren repetidas veces. Generalmente se requiere de una ciruga como tratamiento permanente.    SOLICITE AYUDA DE INMEDIATO SI:   El dolor empeora.   El dolor no se alivia con los medicamentos.   Tiene fiebre o sntomas que persisten durante ms de 2-3 das.   Tiene fiebre y los sntomas empeoran repentinamente.   Siente nuseas y vomita.    ASEGRESE DE QUE:   Comprende estas instrucciones.   Controlar su afeccin.   Recibir ayuda de inmediato si no mejora o si empeora.    Esta informacin no tiene como fin reemplazar el consejo del mdico. Asegrese de hacerle al mdico cualquier pregunta que tenga.  Document Released: 01/19/2009 Document Revised: 06/25/2013 Document Reviewed: 04/16/2013  [employer]vier Interactive Patient Education  2017 [employer]

## 2016-12-16 NOTE — MAU Provider Note (Signed)
History   [id number] who has hx of gallstones with her last pregnancy but did not keep appt with general surgeon after delivery in with RUQ pain that started this afternoon. Some vomiting but that has been going on since early pregnancy. Also  ptyalism  During this pregnancy.  CSN: [id number]  Arrival date & time 12/16/16  1903   First Provider Initiated Contact with Patient 12/16/16 1944      Chief Complaint  Patient presents with  . Abdominal Pain  . Back Pain    HPI  Past Medical History:  Diagnosis Date  . Gall stones   . No pertinent past medical history     Past Surgical History:  Procedure Laterality Date  . NO PAST SURGERIES      History reviewed. No pertinent family history.  Social History  Substance Use Topics  . Smoking status: Never Smoker  . Smokeless tobacco: Never Used  . Alcohol use No    OB History    Gravida Para Term Preterm AB Living   9 8 8    0 7   SAB TAB Ectopic Multiple Live Births   0       3      Review of Systems  Constitutional: Negative.   HENT: Negative.   Eyes: Negative.   Respiratory: Negative.   Cardiovascular: Negative.   Gastrointestinal: Positive for abdominal pain.  Endocrine: Negative.   Genitourinary: Negative.   Musculoskeletal: Negative.   Skin: Negative.     Allergies  Patient has no known allergies.  Home Medications    BP 101/59   Pulse 86   Temp 98.7 F (37.1 C) (Oral)   Resp 18   Ht 4\' 11"  (1.499 m)   Wt 156 lb 6.4 oz (70.9 kg)   LMP 09/01/2016   SpO2 99%   BMI 31.59 kg/m   Physical Exam  Constitutional: She is oriented to person, place, and time. She appears well-developed and well-nourished.  HENT:  Head: Normocephalic.  Eyes: Pupils are equal, round, and reactive to light.  Neck: Normal range of motion.  Cardiovascular: Normal rate, normal heart sounds and intact distal pulses.   Pulmonary/Chest: Effort normal and breath sounds normal.  Abdominal: Soft. There is tenderness.   Genitourinary: Vagina normal and uterus normal.  Musculoskeletal: Normal range of motion.  Neurological: She is alert and oriented to person, place, and time. She has normal reflexes.  Skin: Skin is warm and dry.  Psychiatric: She has a normal mood and affect. Her behavior is normal. Judgment and thought content normal.    MAU Course  Procedures (including critical care time)  Labs Reviewed  URINALYSIS, ROUTINE W REFLEX MICROSCOPIC - Abnormal; Notable for the following:       Result Value   APPearance CLOUDY (*)    All other components within normal limits  CBC WITH DIFFERENTIAL/PLATELET - Abnormal; Notable for the following:    RBC 3.86 (*)    Hemoglobin 10.8 (*)    HCT 32.0 (*)    All other components within normal limits  COMPREHENSIVE METABOLIC PANEL  LIPASE, BLOOD  AMYLASE   No results found.   No diagnosis found.    MDM  VSS, mild RUQ tenderness. All labs WNL. States feeling better after pain med. Discussed diet at length. Will d/c pt home.

## 2016-12-16 NOTE — MAU Note (Signed)
Having pain in upper abd that radiates around to right and to back. I have gallstones so is not a new pain but is worse today. Some n/v  But no diarrhea. Denies vag bleeding or d/c and no pregnancy problems

## 2017-01-04 HISTORY — PX: CHOLECYSTECTOMY: SHX55

## 2017-05-27 ENCOUNTER — Inpatient Hospital Stay (HOSPITAL_COMMUNITY)
Admission: AD | Admit: 2017-05-27 | Discharge: 2017-05-30 | DRG: 775 | Disposition: A | Discharge status: Home / Self Care | Payer: Medicaid Other | Source: Ambulatory Visit | Attending: Obstetrics & Gynecology | Admitting: Obstetrics & Gynecology

## 2017-05-27 ENCOUNTER — Encounter (HOSPITAL_COMMUNITY): Payer: Self-pay | Admitting: Obstetrics and Gynecology

## 2017-05-27 DIAGNOSIS — O99824 Streptococcus B carrier state complicating childbirth: Secondary | ICD-10-CM | POA: Diagnosis present

## 2017-05-27 DIAGNOSIS — Z8759 Personal history of other complications of pregnancy, childbirth and the puerperium: Secondary | ICD-10-CM

## 2017-05-27 DIAGNOSIS — Z3A38 38 weeks gestation of pregnancy: Secondary | ICD-10-CM | POA: Diagnosis not present

## 2017-05-27 DIAGNOSIS — O09523 Supervision of elderly multigravida, third trimester: Secondary | ICD-10-CM | POA: Diagnosis present

## 2017-05-27 DIAGNOSIS — Z3A39 39 weeks gestation of pregnancy: Secondary | ICD-10-CM | POA: Diagnosis not present

## 2017-05-27 DIAGNOSIS — Z3493 Encounter for supervision of normal pregnancy, unspecified, third trimester: Secondary | ICD-10-CM | POA: Diagnosis present

## 2017-05-27 HISTORY — DX: Personal history of other complications of pregnancy, childbirth and the puerperium: Z87.59

## 2017-05-27 HISTORY — DX: Supervision of elderly multigravida, third trimester: O09.523

## 2017-05-27 LAB — CBC
HCT: 31.6 % — ABNORMAL LOW (ref 36.0–46.0)
HEMOGLOBIN: 10.1 g/dL — AB (ref 12.0–15.0)
MCH: 25.6 pg — ABNORMAL LOW (ref 26.0–34.0)
MCHC: 32 g/dL (ref 30.0–36.0)
MCV: 80 fL (ref 78.0–100.0)
Platelets: 275 10*3/uL (ref 150–400)
RBC: 3.95 MIL/uL (ref 3.87–5.11)
RDW: 15 % (ref 11.5–15.5)
WBC: 12.1 10*3/uL — ABNORMAL HIGH (ref 4.0–10.5)

## 2017-05-27 LAB — TYPE AND SCREEN
ABO/RH(D): O POS
ANTIBODY SCREEN: NEGATIVE

## 2017-05-27 MED ORDER — OXYTOCIN 10 UNIT/ML IJ SOLN
10.0000 [IU] | Freq: Once | INTRAMUSCULAR | Status: DC | PRN
  Filled 2017-05-27: qty 1

## 2017-05-27 MED ORDER — PHENYLEPHRINE 40 MCG/ML (10ML) SYRINGE FOR IV PUSH (FOR BLOOD PRESSURE SUPPORT)
80.0000 ug | PREFILLED_SYRINGE | INTRAVENOUS | Status: DC | PRN
  Filled 2017-05-27: qty 5

## 2017-05-27 MED ORDER — LIDOCAINE HCL (PF) 1 % IJ SOLN
30.0000 mL | INTRAMUSCULAR | Status: DC | PRN
  Filled 2017-05-27: qty 30

## 2017-05-27 MED ORDER — EPHEDRINE 5 MG/ML INJ
10.0000 mg | INTRAVENOUS | Status: DC | PRN
  Filled 2017-05-27: qty 2

## 2017-05-27 MED ORDER — OXYCODONE-ACETAMINOPHEN 5-325 MG PO TABS: 2.0000 | ORAL_TABLET | ORAL | Status: DC | PRN

## 2017-05-27 MED ORDER — OXYTOCIN 40 UNITS IN LACTATED RINGERS INFUSION - SIMPLE MED
2.5000 [IU]/h | INTRAVENOUS | Status: DC
  Filled 2017-05-27: qty 1000

## 2017-05-27 MED ORDER — LACTATED RINGERS IV SOLN
500.0000 mL | Freq: Once | INTRAVENOUS | Status: AC
  Administered 2017-05-28: 500 mL via INTRAVENOUS

## 2017-05-27 MED ORDER — OXYTOCIN BOLUS FROM INFUSION: 500.0000 mL | Freq: Once | INTRAVENOUS | Status: DC

## 2017-05-27 MED ORDER — AMPICILLIN SODIUM 2 G IJ SOLR
2.0000 g | Freq: Once | INTRAMUSCULAR | Status: AC
  Administered 2017-05-27: 2 g via INTRAVENOUS
  Filled 2017-05-27: qty 2000

## 2017-05-27 MED ORDER — LACTATED RINGERS IV SOLN: INTRAVENOUS | Status: DC

## 2017-05-27 MED ORDER — OXYCODONE-ACETAMINOPHEN 5-325 MG PO TABS: 1.0000 | ORAL_TABLET | ORAL | Status: DC | PRN

## 2017-05-27 MED ORDER — SOD CITRATE-CITRIC ACID 500-334 MG/5ML PO SOLN: 30.0000 mL | ORAL | Status: DC | PRN

## 2017-05-27 MED ORDER — LACTATED RINGERS IV SOLN: 500.0000 mL | INTRAVENOUS | Status: DC | PRN

## 2017-05-27 MED ORDER — FENTANYL 2.5 MCG/ML BUPIVACAINE 1/10 % EPIDURAL INFUSION (WH - ANES): 14.0000 mL/h | INTRAMUSCULAR | Status: DC | PRN

## 2017-05-27 MED ORDER — FENTANYL CITRATE (PF) 100 MCG/2ML IJ SOLN
100.0000 ug | INTRAMUSCULAR | Status: DC | PRN
  Administered 2017-05-28: 100 ug via INTRAVENOUS
  Filled 2017-05-27: qty 2

## 2017-05-27 MED ORDER — DIPHENHYDRAMINE HCL 50 MG/ML IJ SOLN: 12.5000 mg | INTRAMUSCULAR | Status: DC | PRN

## 2017-05-27 MED ORDER — ACETAMINOPHEN 325 MG PO TABS: 650.0000 mg | ORAL_TABLET | ORAL | Status: DC | PRN

## 2017-05-27 NOTE — MAU Note (Signed)
Urine in lab 

## 2017-05-27 NOTE — H&P (Signed)
Beverly Flynn is a 41 y.o. female presenting for contractions every 3-5 minutes.  OB History    Gravida Para Term Preterm AB Living   9 8 8    0 7   SAB TAB Ectopic Multiple Live Births   0       7     Past Medical History:  Diagnosis Date  . AMA (advanced maternal age) multigravida 35+, third trimester 05/27/2017  . Gall stones   . History of IUFD - 47 in [place] 05/27/2017  . Indication for care in labor or delivery 05/27/2017   Past Surgical History:  Procedure Laterality Date  . CHOLECYSTECTOMY  01/04/2017   at [facility]   Family History: family history is not on file. Social History:  reports that she has never smoked. She has never used smokeless tobacco. She reports that she does not drink alcohol or use drugs.     Maternal Diabetes: No Genetic Screening: Normal NT/NB - declined 1st screen/NIPS Maternal Ultrasounds/Referrals: Normal Fetal Ultrasounds or other Referrals:  None Maternal Substance Abuse:  No Significant Maternal Medications:  None Significant Maternal Lab Results:  Lab values include: Group B Strep positive Other Comments:  AMA  Review of Systems  Constitutional: Negative.   HENT: Negative.   Eyes: Negative.   Respiratory: Negative.   Cardiovascular: Negative.   Gastrointestinal: Positive for abdominal pain (contractions every 5 mins).  Genitourinary: Negative.   Musculoskeletal: Negative.   Skin: Negative.   Neurological: Negative.   Endo/Heme/Allergies: Negative.   Psychiatric/Behavioral: Negative.    Maternal Medical History:  Reason for admission: Contractions.   Contractions: Onset was 13-24 hours ago.   Frequency: regular.   Duration is approximately 5 minutes.   Perceived severity is strong.    Fetal activity: Perceived fetal activity is normal.   Last perceived fetal movement was within the past hour.    Prenatal complications: AMA  Prenatal Complications - Diabetes: none.    Dilation: 4 Effacement  (%): 70 Station: -2 Exam by:: Karl Ito, rnc    Blood pressure (!) 114/55, pulse 93, temperature 97.8 F (36.6 C), temperature source Oral, resp. rate 18, height 4\' 11"  (1.499 m), weight 70.8 kg (156 lb), last menstrual period 09/01/2016.  Maternal Exam:  Uterine Assessment: Contraction strength is firm.  Contraction duration is 3 minutes. Contraction frequency is regular.   Abdomen: Patient reports no abdominal tenderness. Fetal presentation: vertex  Introitus: Ferning test: not done.  Nitrazine test: not done. Amniotic fluid character: not assessed.  Pelvis: adequate for delivery.   Cervix: Cervix evaluated by digital exam.     Fetal Exam Fetal Monitor Review: Mode: ultrasound.   Baseline rate: 145.  Variability: moderate (6-25 bpm).   Pattern: accelerations present and no decelerations.    Fetal State Assessment: Category I - tracings are normal.     Physical Exam  Constitutional: She is oriented to person, place, and time. She appears well-developed and well-nourished.  HENT:  Head: Normocephalic.  Eyes: Pupils are equal, round, and reactive to light.  Neck: Normal range of motion.  Cardiovascular: Normal rate, regular rhythm, normal heart sounds and intact distal pulses.   Respiratory: Effort normal and breath sounds normal.  GI: Soft. Bowel sounds are normal.  Genitourinary:  Genitourinary Comments: Uterus: gravid, S>D, Dilation: 4 Effacement (%): 70 Station: -2 Presentation: Vertex Exam by:: Karl Ito, rnc    Musculoskeletal: Normal range of motion.  Neurological: She is alert and oriented to person, place, and time. She has normal reflexes.  Skin: Skin is warm and dry.  Psychiatric: She has a normal mood and affect. Her behavior is normal. Judgment and thought content normal.    Prenatal labs: ABO, Rh: --/--/O POS (07/22 2240) Antibody: NEGATIVE Rubella:  IMMUNE RPR:  NEGATIVE HBsAg:  NEGATIVE HIV:  NON-REACTIVE GBS:   POSITIVE  Assessment/Plan: Active Labor at Term Previous Term IUFD, currently pregnant AMA (+)GBS  #Labor: Expectant management, AROM #Pain:  Labor support, IV meds as needed, Epidural upon request once progressing #FWB:Cat 1 #ID: N/A #MOF: Both #MOC: Pills #Circ:  No  Beverly Moraolitta Laney Bagshaw, MSN, CNM 05/27/2017, 11:00 PM

## 2017-05-27 NOTE — MAU Note (Signed)
Contracting since 6 AM.Denies leaking fluid or bleeding.Good fetal movement.

## 2017-05-28 ENCOUNTER — Encounter (HOSPITAL_COMMUNITY): Payer: Self-pay | Admitting: Obstetrics and Gynecology

## 2017-05-28 DIAGNOSIS — O99824 Streptococcus B carrier state complicating childbirth: Secondary | ICD-10-CM

## 2017-05-28 DIAGNOSIS — Z3A39 39 weeks gestation of pregnancy: Secondary | ICD-10-CM

## 2017-05-28 MED ORDER — IBUPROFEN 600 MG PO TABS
600.0000 mg | ORAL_TABLET | Freq: Four times a day (QID) | ORAL | Status: DC
  Administered 2017-05-28 – 2017-05-30 (×8): 600 mg via ORAL
  Filled 2017-05-28 (×9): qty 1

## 2017-05-28 MED ORDER — DIBUCAINE 1 % RE OINT: 1.0000 "application " | TOPICAL_OINTMENT | RECTAL | Status: DC | PRN

## 2017-05-28 MED ORDER — WITCH HAZEL-GLYCERIN EX PADS: 1.0000 "application " | MEDICATED_PAD | CUTANEOUS | Status: DC | PRN

## 2017-05-28 MED ORDER — BENZOCAINE-MENTHOL 20-0.5 % EX AERO: 1.0000 "application " | INHALATION_SPRAY | CUTANEOUS | Status: DC | PRN

## 2017-05-28 MED ORDER — COCONUT OIL OIL: 1.0000 "application " | TOPICAL_OIL | Status: DC | PRN

## 2017-05-28 MED ORDER — FENTANYL CITRATE (PF) 100 MCG/2ML IJ SOLN: 100.0000 ug | INTRAMUSCULAR | Status: DC | PRN

## 2017-05-28 MED ORDER — SENNOSIDES-DOCUSATE SODIUM 8.6-50 MG PO TABS
2.0000 | ORAL_TABLET | ORAL | Status: DC
  Administered 2017-05-29 – 2017-05-30 (×2): 2 via ORAL
  Filled 2017-05-28 (×2): qty 2

## 2017-05-28 MED ORDER — TETANUS-DIPHTH-ACELL PERTUSSIS 5-2.5-18.5 LF-MCG/0.5 IM SUSP: 0.5000 mL | Freq: Once | INTRAMUSCULAR | Status: DC

## 2017-05-28 MED ORDER — SIMETHICONE 80 MG PO CHEW
80.0000 mg | CHEWABLE_TABLET | ORAL | Status: DC | PRN
  Administered 2017-05-29: 80 mg via ORAL

## 2017-05-28 MED ORDER — ONDANSETRON HCL 4 MG PO TABS: 4.0000 mg | ORAL_TABLET | ORAL | Status: DC | PRN

## 2017-05-28 MED ORDER — PRENATAL MULTIVITAMIN CH
1.0000 | ORAL_TABLET | Freq: Every day | ORAL | Status: DC
  Administered 2017-05-28 – 2017-05-30 (×3): 1 via ORAL
  Filled 2017-05-28 (×3): qty 1

## 2017-05-28 MED ORDER — DIPHENHYDRAMINE HCL 25 MG PO CAPS: 25.0000 mg | ORAL_CAPSULE | Freq: Four times a day (QID) | ORAL | Status: DC | PRN

## 2017-05-28 MED ORDER — ZOLPIDEM TARTRATE 5 MG PO TABS: 5.0000 mg | ORAL_TABLET | Freq: Every evening | ORAL | Status: DC | PRN

## 2017-05-28 MED ORDER — ONDANSETRON HCL 4 MG/2ML IJ SOLN: 4.0000 mg | INTRAMUSCULAR | Status: DC | PRN

## 2017-05-28 MED ORDER — ACETAMINOPHEN 325 MG PO TABS
650.0000 mg | ORAL_TABLET | ORAL | Status: DC | PRN
Start: 2017-05-28 — End: 2017-05-30

## 2017-05-28 NOTE — Progress Notes (Signed)
Patient ID: Beverly LevinsSantiaga Thaker, female   DOB: 14-Mar-1976, 41 y.o.   MRN: [id number] S: Very uncomfortable with contractions. Requesting IV pain meds for relief.  Increasing pelvic pressure with each contraction.   O: Vitals:   05/27/17 2330 05/28/17 0017 05/28/17 0045 05/28/17 0139  BP:  114/68 112/64 123/70  Pulse:  100 98 84  Resp: 20 18 18 20   Temp:   97.8 F (36.6 C)   TempSrc:   Oral   Weight:      Height:         FHT:  FHR: 140 bpm, variability: moderate,  accelerations:  Present,  decelerations:  Absent UC:   regular, every 3-5.5 minutes SVE:   Dilation: 8 Effacement (%): 100 Station: 0/+1 Exam by:: Arita Missawson, CNM AROM with copious amounts of light meconium stained fluid   A / P: Spontaneous labor, progressing normally  Fetal Wellbeing:  Category I Pain Control:  Labor support without medications and IV pain meds  Anticipated MOD:  NSVD  Raelyn Moraolitta Jehiel Koepp, MSN, CNM 05/28/2017, 1:05 AM

## 2017-05-28 NOTE — Progress Notes (Signed)
  Subjective: Beverly Flynn is a 41 y.o. [id number] at 539w3d by LMP admitted for active labor.  Feeling lots of pressure with contractions.  Post Fentanyl administration with minimal pain relief.   Objective: BP 123/70   Pulse 84   Temp 97.8 F (36.6 C) (Oral)   Resp 20   Ht 4\' 11"  (1.499 m)   Wt 70.8 kg (156 lb)   LMP 09/01/2016   BMI 31.51 kg/m  No intake/output data recorded. No intake/output data recorded.  FHT:  FHR: 160 bpm, variability: moderate,  accelerations:  Present,  decelerations:  Present variables UC:   regular, every 2-3 minutes SVE:   Dilation: 9 Effacement (%): 100 Station: +1 Exam by:: Arita Missawson, CNM IUPC placed without difficulty / amnioinfusion start with bolus of 300 ml then 150 ml/hr   Labs: Lab Results  Component Value Date   WBC 12.1 (H) 05/27/2017   HGB 10.1 (L) 05/27/2017   HCT 31.6 (L) 05/27/2017   MCV 80.0 05/27/2017   PLT 275 05/27/2017    Assessment / Plan: Spontaneous labor, progressing normally  Labor: Progressing normally Preeclampsia:  n/a+ Fetal Wellbeing:  Category II Pain Control:  IV pain meds I/D:  n/a Anticipated MOD:  NSVD *Dr. Erin FullingHarraway-Smith made aware of pt's progress, variables in FHR tracing and  - ok with plan of care  Raelyn Moraolitta Novi Calia, MSN, CNM 05/28/2017, 1:52 AM

## 2017-05-29 LAB — CBC
HCT: 27.8 % — ABNORMAL LOW (ref 36.0–46.0)
Hemoglobin: 8.9 g/dL — ABNORMAL LOW (ref 12.0–15.0)
MCH: 25.6 pg — ABNORMAL LOW (ref 26.0–34.0)
MCHC: 32 g/dL (ref 30.0–36.0)
MCV: 80.1 fL (ref 78.0–100.0)
PLATELETS: 225 10*3/uL (ref 150–400)
RBC: 3.47 MIL/uL — ABNORMAL LOW (ref 3.87–5.11)
RDW: 15.3 % (ref 11.5–15.5)
WBC: 10.1 10*3/uL (ref 4.0–10.5)

## 2017-05-29 LAB — RPR: RPR: NONREACTIVE

## 2017-05-29 MED ORDER — IBUPROFEN 600 MG PO TABS: 600.0000 mg | ORAL_TABLET | Freq: Four times a day (QID) | ORAL | 0 refills | Status: DC

## 2017-05-29 MED ORDER — FERROUS SULFATE 325 (65 FE) MG PO TABS: 325.0000 mg | ORAL_TABLET | Freq: Two times a day (BID) | ORAL | 3 refills | Status: AC

## 2017-05-29 NOTE — Progress Notes (Signed)
POSTPARTUM PROGRESS NOTE  Post Partum Day 1 Subjective:  Beverly Flynn is a 41 y.o. [id number] 3830w3d s/Flynn NSVD.  No acute events overnight.  Pt denies problems with ambulating, voiding or po intake.  She denies nausea or vomiting.  Pain is well controlled.  She has had flatus. She has had bowel movement.  Lochia Small.   Objective: Blood pressure 99/67, pulse 77, temperature 98.9 F (37.2 C), temperature source Oral, resp. rate 18, height 4\' 11"  (1.499 m), weight 70.8 kg (156 lb), last menstrual period 09/01/2016, SpO2 100 %, unknown if currently breastfeeding.  Physical Exam:  General: alert, cooperative and no distress Lochia:normal flow Chest: CTAB Heart: RRR no m/r/g Abdomen: +BS, soft, nontender,  Uterine Fundus: firm DVT Evaluation: No calf swelling or tenderness Extremities: no edema   Recent Labs  05/27/17 2240 05/29/17 0504  HGB 10.1* 8.9*  HCT 31.6* 27.8*    Assessment/Plan:  ASSESSMENT: Beverly Flynn is a 41 y.o. [id number] 230w3d s/Flynn NSVD, PPD#1    LOS: 2 days   Sullivan LoneBrannon L Inman, Medical Student   OB FELLOW MEDICAL STUDENT NOTE ATTESTATION  I confirm that I have verified the information documented in the resident's note and that I have also personally performed the physical exam and all medical decision making activities.  Okay to d/c home today. Planning on OCPs for contraception.    Beverly Flynn Beverly Bove, MD OB Fellow 05/29/2017, 10:17 AM

## 2017-05-29 NOTE — Discharge Instructions (Signed)
Parto vaginal, cuidados de puerperio °(Postpartum Care After Vaginal Delivery) °El período de tiempo que sigue inmediatamente al parto se conoce como puerperio. °¿QUÉ TIPO DE ATENCIÓN MÉDICA RECIBIRÉ? °· Podría continuar recibiendo medicamentos y líquidos través de una vía intravenosa (IV) que se colocará en una de sus venas. °· Si se le realizó una incisión cerca de la vagina (episiotomía) o si ha tenido algún desgarro durante el parto, podrían indicarle que se coloque compresas frías sobre la episiotomía o el desgarro. Esto ayuda a aliviar el dolor y la hinchazón. °· Es posible que le den una botella rociadora para que use cuando vaya al baño. Puede utilizarla hasta que se sienta cómoda limpiándose de la manera habitual. Siga los pasos a continuación para usar la botella rociadora: °? Antes de orinar, llene la botella rociadora con agua tibia. No use agua caliente. °? Después de orinar, mientras aún está sentada en el inodoro, use la botella rociadora para enjuagar el área alrededor de la uretra y la abertura vaginal. Con esto podrá limpiar cualquier rastro de orina y sangre. °? Puede hacer esto en lugar de secarse. Cuando comience a sanar, podrá usar la botella rociadora antes de secarse. Asegúrese de secarse suavemente. °? Llene la botella rociadora con agua limpia cada vez que vaya al baño. °· Deberá usar apósitos sanitarios. °¿CÓMO PUEDO SENTIRME? °· Quizás no tenga necesidad de orinar durante varias horas después del parto. °· Sentirá algo de dolor y molestias en el abdomen y la vagina. °· Si está amamantando, podría tener contracciones uterinas cada vez que lo haga. Estas podrían prolongarse hasta varias semanas durante el puerperio. Las contracciones uterinas ayudan al útero a regresar a su tamaño habitual. °· Es normal tener un poco de hemorragia vaginal (loquios) después del parto. La cantidad y apariencia de los loquios a menudo es similar a las del período menstrual la primera semana después del parto.  Disminuirá gradualmente las siguientes semanas hasta convertirse en una descarga seca amarronada o amarillenta. En la mayoría de las mujeres, los loquios se detienen completamente entre 6 a 8 semanas después del parto. Los sangrados vaginales pueden variar de mujer a mujer. °· Los primeros días después del parto, podría padecer congestión mamaria. Los pechos se sentirán pesados, llenos y molestos. Las mamas también podrían latir y ponerse duras, muy tirantes, calientes y sensibles al tacto. Cuando esto ocurra, podría notar leche que se escapa de los senos. El médico puede recomendarle algunos métodos para aliviar este malestar causado por la congestión mamaria. La congestión mamaria debería desaparecer al cabo de unos días. °· Podría sentirse más deprimida o preocupada que lo habitual debido a los cambios hormonales luego del parto. Estos sentimientos no deben durar más de unos pocos días. Si no desaparecen al cabo de algunos días, hable con su médico. °¿QUÉ CUIDADOS DEBO TENER? °· Infórmele a su médico si siente dolor o malestar. °· Beba suficiente agua para mantener la orina clara o de color amarillo pálido. °· Lávese bien las manos con agua y jabón durante al menos 20 segundos después de cambiar el apósito sanitario, usar el baño o antes de sostener o alimentar al bebé. °· Si no está amamantando, evite tocarse mucho los senos. Al hacerlo, podrían producir más leche. °· Si se siente débil o mareada, o si siente que está a punto de desmayarse, pida ayuda antes de realizar lo siguiente: °? Levantarse de la cama. °? Ducharse. °· Cambie los apósitos sanitarios con frecuencia. Observe si hay cambios en el flujo, como un aumento repentino en el   volumen, cambios en el color o coágulos sanguíneos de gran tamaño. Si expulsa un coágulo sanguíneo por la vagina, guárdelo para mostrárselo a su médico. No tire la cadena sin que el médico examine el coágulo antes. °· Asegúrese de tener todas las vacunas al día. Esto la ayudará a  estar protegida y a proteger al bebé de determinadas enfermedades. Podría necesitar vacunas antes de dejar el hospital. °· Si lo desea, hable con el médico acerca de los métodos de planificación familiar o control de la natalidad (métodos anticonceptivos). °¿CÓMO PUEDO ESTABLECER LAZOS CON MI BEBÉ? °Pasar tanto tiempo como le sea posible con el bebé es sumamente importante. Durante ese tiempo, usted y su bebé pueden conocerse y desarrollar lazos. Tener al bebé con usted en la habitación le dará tiempo de conocerlo. Esto también puede hacerla sentir más cómoda para atender al bebé. Amamantar también puede ayudarla a crear lazos con el bebé. °¿CÓMO PUEDO PLANIFICAR MI REGRESO A CASA CON EL BEBÉ? °· Asegúrese de tener instalada una butaca en el automóvil. °? La butaca debe contar con la certificación del fabricante para asegurarse de que esté instalada en forma segura. °? Asegúrese de que el bebé quede bien asegurado en la butaca. °· Pregúntele al médico todo lo que necesite saber sobre los cuidados de su bebé. Asegúrese de poder comunicarse con el médico en caso de que tenga preguntas luego de dejar el hospital. °Esta información no tiene como fin reemplazar el consejo del médico. Asegúrese de hacerle al médico cualquier pregunta que tenga. °Document Released: 08/20/2007 Document Revised: 02/14/2016 Document Reviewed: 09/27/2015 °Elsevier Interactive Patient Education © 2018 [employer]vier Inc[employer] ° °

## 2017-05-29 NOTE — Discharge Summary (Signed)
OB Discharge Summary     Patient Name: Beverly Flynn DOB: Mar 30, 1976 MRN: [id number]  Date of admission: 05/27/2017 Delivering MD: Raelyn Mora   Date of discharge: 05/29/2017  Admitting diagnosis: 38 wk ctx  Intrauterine pregnancy: [redacted]w[redacted]d     Secondary diagnosis:  Principal Problem:   SVD (spontaneous vaginal delivery) Active Problems:   AMA (advanced maternal age) multigravida 35+, third trimester   History of IUFD   Postpartum care following vaginal delivery  Additional problems: none     Discharge diagnosis: Term Pregnancy Delivered                                                                                                Post partum procedures:none  Augmentation: AROM  Complications: None  Hospital course:  Onset of Labor With Vaginal Delivery     41 y.o. yo [id number] at [redacted]w[redacted]d was admitted in Active Labor on 05/27/2017. Patient had an uncomplicated labor course as follows:  Membrane Rupture Time/Date: 1:04 AM ,05/28/2017   Intrapartum Procedures: Episiotomy: None [1]                                         Lacerations:  None [1]  Patient had a delivery of a Viable infant. 05/28/2017  Information for the patient's newborn:  Beverly, Flynn [[id number]]  Delivery Method: Vaginal, Spontaneous Delivery (Filed from Delivery Summary)    Pateint had an uncomplicated postpartum course.  She is ambulating, tolerating a regular diet, passing flatus, and urinating well. Patient is discharged home in stable condition on 05/29/17.   Physical exam  Vitals:   05/28/17 0352 05/28/17 0456 05/28/17 0900 05/29/17 0605  BP: (!) 120/57 111/62 (!) 97/57 99/67  Pulse: 88 81 76 77  Resp: 18 18 18 18   Temp: 97.7 F (36.5 C) 98.2 F (36.8 C) 98.6 F (37 C) 98.9 F (37.2 C)  TempSrc: Oral Oral Oral Oral  SpO2: 98%  100%   Weight:      Height:       General: alert, cooperative and no distress Lochia: appropriate Uterine Fundus: firm Incision: N/A DVT  Evaluation: No evidence of DVT seen on physical exam. No significant calf/ankle edema. Labs: Lab Results  Component Value Date   WBC 10.1 05/29/2017   HGB 8.9 (L) 05/29/2017   HCT 27.8 (L) 05/29/2017   MCV 80.1 05/29/2017   PLT 225 05/29/2017   CMP Latest Ref Rng & Units 12/16/2016  Glucose 65 - 99 mg/dL 90  BUN 6 - 20 mg/dL 11  Creatinine 1.30 - 8.65 mg/dL 7.84  Sodium 696 - 295 mmol/L 137  Potassium 3.5 - 5.1 mmol/L 3.8  Chloride 101 - 111 mmol/L 107  CO2 22 - 32 mmol/L 26  Calcium 8.9 - 10.3 mg/dL 2.8(U)  Total Protein 6.5 - 8.1 g/dL 6.7  Total Bilirubin 0.3 - 1.2 mg/dL 1.3(K)  Alkaline Phos 38 - 126 U/L 58  AST 15 - 41 U/L 13(L)  ALT 14 - 54 U/L 14  Discharge instruction: per After Visit Summary and "Baby and Me Booklet".  After visit meds:  Allergies as of 05/29/2017   No Known Allergies     Medication List    TAKE these medications   ferrous sulfate 325 (65 FE) MG tablet Take 1 tablet (325 mg total) by mouth 2 (two) times daily with a meal.   ibuprofen 600 MG tablet Commonly known as:  ADVIL,MOTRIN Take 1 tablet (600 mg total) by mouth every 6 (six) hours.   prenatal multivitamin Tabs tablet Take 1 tablet by mouth daily.       Diet: routine diet  Activity: Advance as tolerated. Pelvic rest for 6 weeks.   Outpatient follow up:6 weeks Follow up Appt:No future appointments. Follow up Visit:No Follow-up on file.  Postpartum contraception: Progesterone only pills  Newborn Data: Live born female  Birth Weight: 8 lb 6.2 oz (3805 g) APGAR: 9, 9  Baby Feeding: Bottle and Breast Disposition:home with mother   05/29/2017 Frederik PearJulie P Degele, MD

## 2017-05-30 NOTE — Discharge Summary (Signed)
OB Discharge Summary  Patient Name: Beverly Flynn DOB: December 29, 1975 MRN: [id number]  Date of admission: 05/27/2017 Delivering MD: Raelyn MoraAWSON, ROLITTA   Date of discharge: 05/30/2017  Admitting diagnosis: 38 wk ctx  Intrauterine pregnancy: 6034w3d     Secondary diagnosis:  Principal Problem:   SVD (spontaneous vaginal delivery) Active Problems:   AMA (advanced maternal age) multigravida 35+, third trimester   History of IUFD   Postpartum care following vaginal delivery  Additional problems: none                                      Discharge diagnosis: Term Pregnancy Delivered                                                                                                Post partum procedures:none  Augmentation: AROM  Complications: None  Hospital course:  Onset of Labor With Vaginal Delivery     41 y.o. yo [id number] at 5534w3d was admitted in Active Labor on 05/27/2017. Patient had an uncomplicated labor course as follows:  Membrane Rupture Time/Date: 1:04 AM ,05/28/2017   Intrapartum Procedures: Episiotomy: None [1]                                         Lacerations:  None [1]  Patient had a delivery of a Viable infant. 05/28/2017  Information for the patient's newborn:  Dannette BarbaraOsorioReyes, Boy Andreal [[id number]][[id number]]  Delivery Method: Vaginal, Spontaneous Delivery (Filed from Delivery Summary)    Pateint had an uncomplicated postpartum course.  She is ambulating, tolerating a regular diet, passing flatus, and urinating well. Patient is discharged home in stable condition on 05/29/17.   Physical exam        Vitals:   05/28/17 0352 05/28/17 0456 05/28/17 0900 05/29/17 0605  BP: (!) 120/57 111/62 (!) 97/57 99/67  Pulse: 88 81 76 77  Resp: 18 18 18 18   Temp: 97.7 F (36.5 C) 98.2 F (36.8 C) 98.6 F (37 C) 98.9 F (37.2 C)  TempSrc: Oral Oral Oral Oral  SpO2: 98%  100%   Weight:      Height:       General: alert, cooperative and no distress Lochia:  appropriate Uterine Fundus: firm Incision: N/A DVT Evaluation: No evidence of DVT seen on physical exam. No significant calf/ankle edema. Labs: Recent Labs       Lab Results  Component Value Date   WBC 10.1 05/29/2017   HGB 8.9 (L) 05/29/2017   HCT 27.8 (L) 05/29/2017   MCV 80.1 05/29/2017   PLT 225 05/29/2017     CMP Latest Ref Rng & Units 12/16/2016  Glucose 65 - 99 mg/dL 90  BUN 6 - 20 mg/dL 11  Creatinine 3.240.44 - 4.011.00 mg/dL 0.270.53  Sodium 253135 - 664145 mmol/L 137  Potassium 3.5 - 5.1 mmol/L 3.8  Chloride 101 - 111 mmol/L 107  CO2 22 - 32 mmol/L 26  Calcium 8.9 - 10.3 mg/dL 9.6(E8.8(L)  Total Protein 6.5 - 8.1 g/dL 6.7  Total Bilirubin 0.3 - 1.2 mg/dL 4.5(W0.2(L)  Alkaline Phos 38 - 126 U/L 58  AST 15 - 41 U/L 13(L)  ALT 14 - 54 U/L 14    Discharge instruction: per After Visit Summary and "Baby and Me Booklet".  After visit meds:  Allergies as of 05/29/2017   No Known Allergies             Medication List     TAKE these medications   ferrous sulfate 325 (65 FE) MG tablet Take 1 tablet (325 mg total) by mouth 2 (two) times daily with a meal.   ibuprofen 600 MG tablet Commonly known as:  ADVIL,MOTRIN Take 1 tablet (600 mg total) by mouth every 6 (six) hours.   prenatal multivitamin Tabs tablet Take 1 tablet by mouth daily.       Diet: routine diet  Activity: Advance as tolerated. Pelvic rest for 6 weeks.   Outpatient follow up:6 weeks Follow up Appt:No future appointments. Follow up Visit:No Follow-up on file.  Postpartum contraception: Progesterone only pills  Newborn Data: Live born female  Birth Weight: 8 lb 6.2 oz (3805 g) APGAR: 9, 9  Baby Feeding: Bottle and Breast Disposition:home with mother    05/30/2017 Marylene LandKathryn Lorraine Kooistra, CNM

## 2017-05-30 NOTE — Progress Notes (Signed)
POSTPARTUM PROGRESS NOTE  Post Partum Day 2 Subjective:  Beverly Flynn is a 41 y.o. [id number] 4233w3d s/p NSVD.  No acute events overnight.  Pt denies problems with ambulating, voiding or po intake.  She denies nausea or vomiting.  Pain is well controlled.  She has had flatus. She has had bowel movement.  Lochia Minimal.   Objective: Blood pressure 114/65, pulse 78, temperature 97.9 F (36.6 C), temperature source Oral, resp. rate 18, height 4\' 11"  (1.499 m), weight 70.8 kg (156 lb), last menstrual period 09/01/2016, SpO2 100 %, unknown if currently breastfeeding.  Physical Exam:  General: alert, cooperative and no distress Lochia:normal flow Chest: CTAB Heart: RRR no m/r/g Abdomen: +BS, soft, nontender,  Uterine Fundus: firm DVT Evaluation: No calf swelling or tenderness Extremities: no edema   Recent Labs  05/27/17 2240 05/29/17 0504  HGB 10.1* 8.9*  HCT 31.6* 27.8*    Assessment/Plan:  ASSESSMENT: Beverly Flynn is a 41 y.o. [id number] 433w3d s/p NSVD  Discharge home   LOS: 3 days   Sullivan LoneBrannon L Inman, Medical Student   I confirm that I have verified the information documented in the student's note and that I have also personally reperformed the physical exam and all medical decision making activities.

## 2017-05-30 NOTE — Lactation Note (Signed)
This note was copied from a baby's chart. Lactation Consultation Note  Patient Name: Beverly Flynn XBJYN'WToday's Date: 05/30/2017 Reason for consult: Follow-up assessment;Infant weight loss - per Eda Baby is 8158 hours old with significant weight loss.  Baby due to feed and showing feeding cues.  LC changed a large wet diaper.  LC watched mom latch at 1st and then [facility] assisted to obtain depth , shallow latch at 1st.  Baby latched well both breast for 20 mins and depth. LC reminded mom to make sure the baby  Nose if close to the breast. Multiple swallows noted. Increased with breast compressions.  Supplemented after feeding both sides with 2 ml of EBM , 20 ml of formula. LC showed mom  And dad how to PACE feed.  Mom denies sore nipples , LC provided breast shells for in between feedings except when sleeping.  To elongate the nipple areola complex. And instructed  Mom on the hand pump. Mom already has a DEBP, And has been post pumping - with 2 ml EBM yield earlier.  Sore nipple and engorgement prevention and tx reviewed.  Per mom active with WIC / [place].     Maternal Data    Feeding Feeding Type: Breast Milk (formula 1st and then formula ) Nipple Type: Slow - flow Length of feed: 20 min (left breast, multiple swallows )  LATCH Score/Interventions Latch: Grasps breast easily, tongue down, lips flanged, rhythmical sucking. Intervention(s): Adjust position;Assist with latch;Breast massage;Breast compression  Audible Swallowing: Spontaneous and intermittent  Type of Nipple: Everted at rest and after stimulation  Comfort (Breast/Nipple): Soft / non-tender     Hold (Positioning): Assistance needed to correctly position infant at breast and maintain latch. Intervention(s): Breastfeeding basics reviewed  LATCH Score: 9  Lactation Tools Discussed/Used Tools: Shells;Pump Shell Type: Inverted Breast pump type: Double-Electric Breast Pump (MOM PLANS TO POST PUMP , ALSO INSTRUCTED  ON HAND PUMP ) WIC Program: Yes (per mom WIC - [place] ) Pump Review: Setup, frequency, and cleaning   Consult Status Consult Status: Follow-up (WILL CHECK ON WEIGHT AT 1400 WITH RN ) Date: 05/30/17 Follow-up type: In-patient    Beverly SprangMargaret Ann Ebone Flynn 05/30/2017, 1:42 PM

## 2017-05-30 NOTE — Lactation Note (Signed)
This note was copied from a baby's chart. Lactation Consultation Note  Patient Name: Beverly Alfred LevinsSantiaga Edgar RUEAV'WToday's Date: 05/30/2017 Reason for consult: Follow-up assessment;Infant weight loss  Dr. Eamc - LanierEFFEFAGH into see mom after weight check , and planning on D/C today.  Per mom Dr. Irish EldersEffefagh - mom feels more comfortable with the hand pump compared  With the DEBP . And plans to use the hand pump , and continue breast feeding and supplement.  Weight check F/U tomorrow at the clinic.    Lactation Tools Discussed/Used Tools: Shells;Pump Shell Type: Inverted Breast pump type: Double-Electric Breast Pump (MOM PLANS TO POST PUMP , ALSO INSTRUCTED ON HAND PUMP ) WIC Program: Yes (per mom WIC - [place] ) Pump Review: Setup, frequency, and cleaning   Consult Status Consult Status: Follow-up (WILL CHECK ON WEIGHT AT 1400 WITH RN ) Date: 05/30/17 Follow-up type: In-patient    Beverly Flynn 05/30/2017, 4:43 PM

## 2018-08-16 IMAGING — US US ABDOMEN LIMITED
1 series · 14 of 25 positions shown · non-contrast
Comparison: None available.

CLINICAL DATA: Initial evaluation for acute right upper quadrant/
epigastric pain.

EXAM:
US ABDOMEN LIMITED - RIGHT UPPER QUADRANT

[Series 1: us abdomen limited · 0.30mm/px · 14 of 41 slices shown]
[im 1/41]
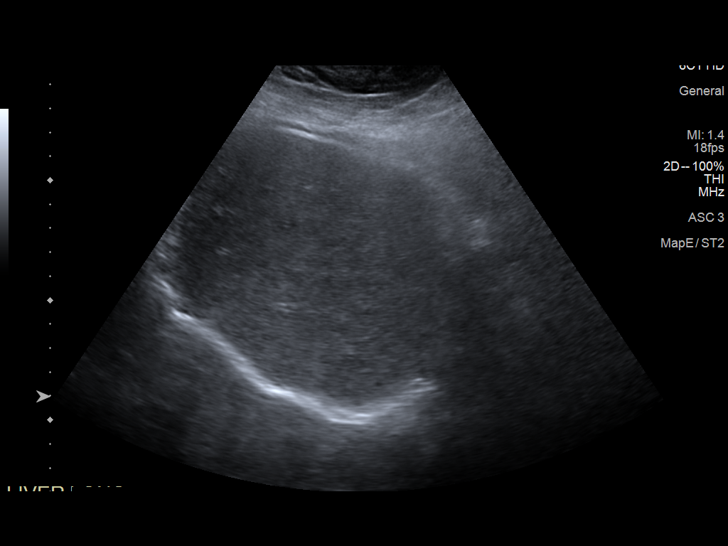
[im 4/41]
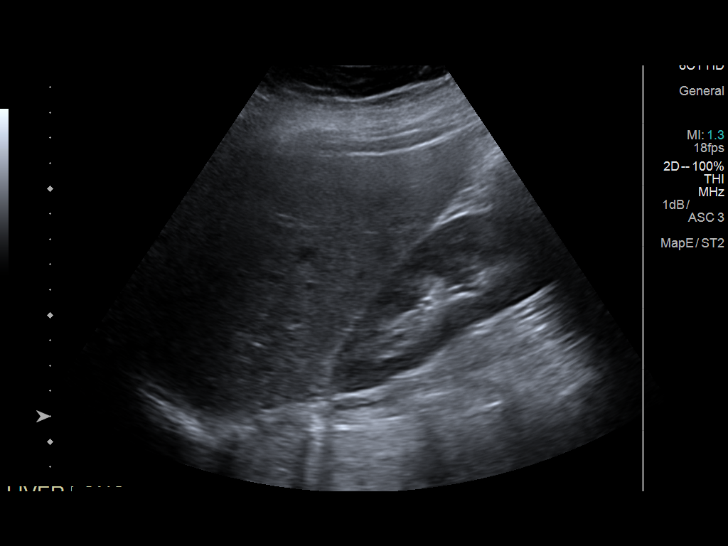
[im 7/41]
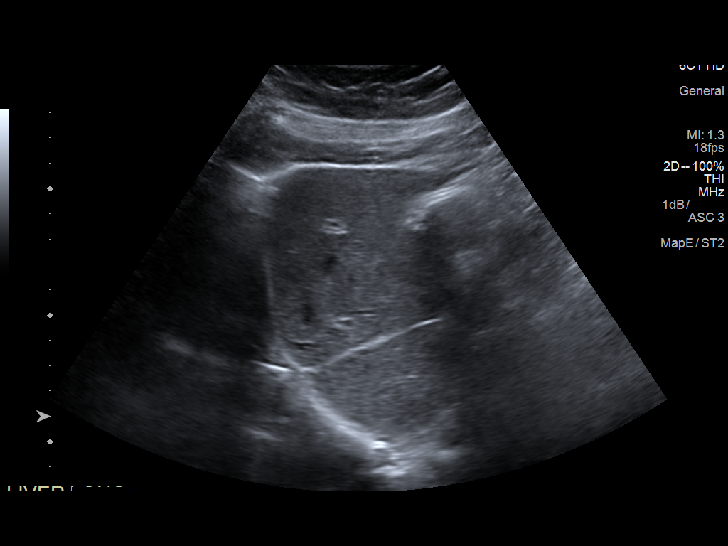
[im 11/41]
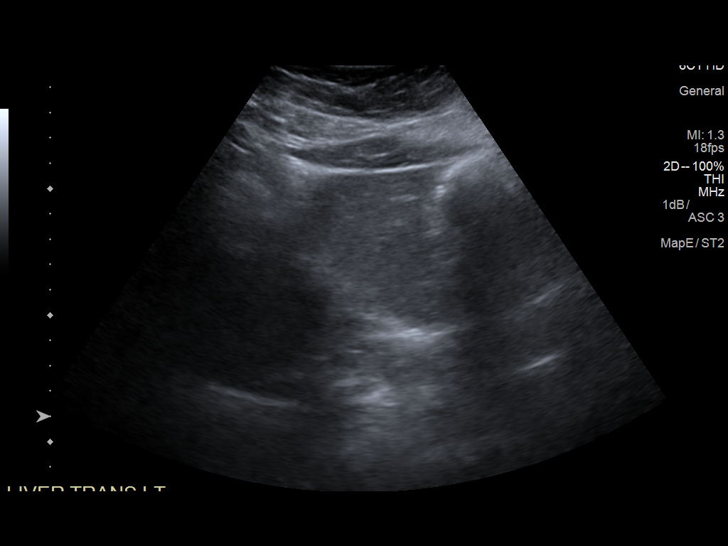
[im 14/41]
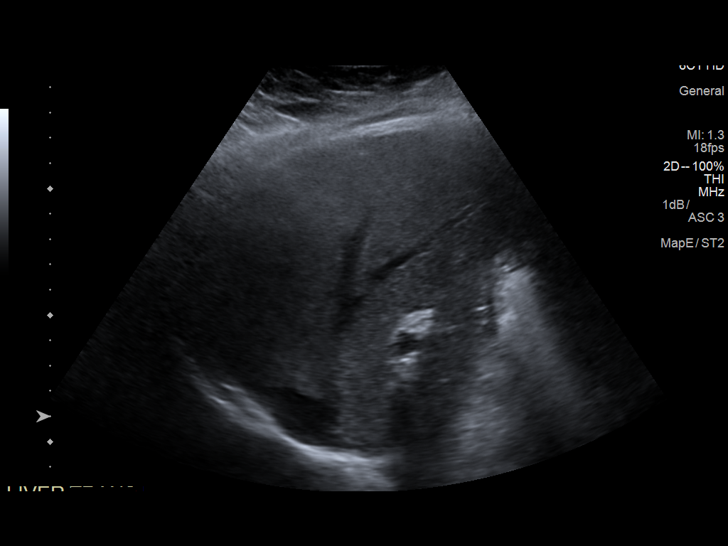
[im 16/41]
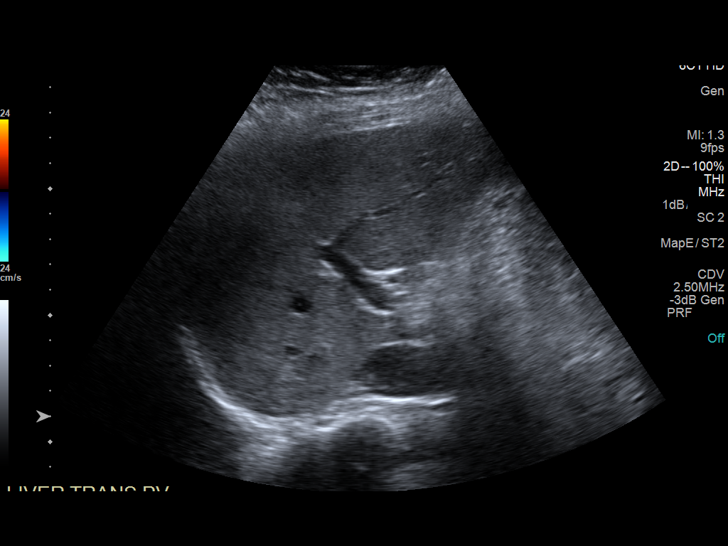
[im 19/41]
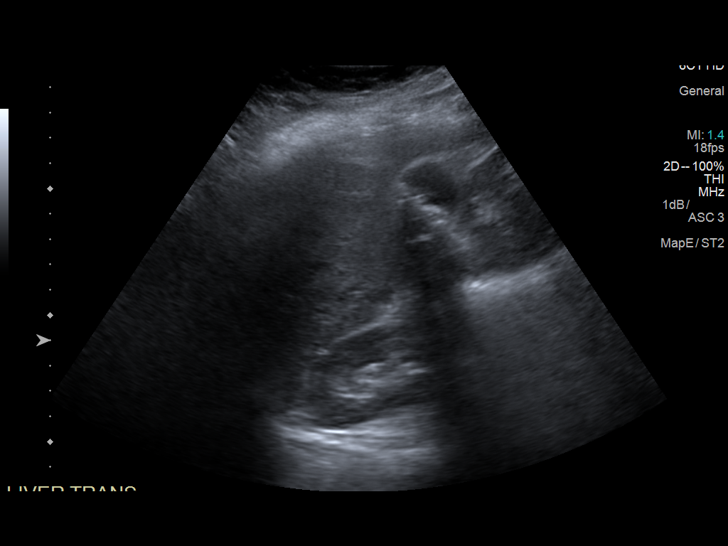
[im 22/41]
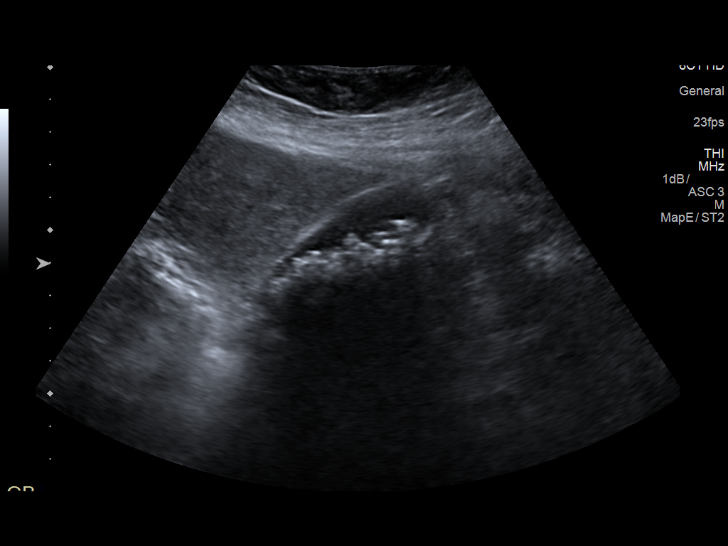
[im 26/41]
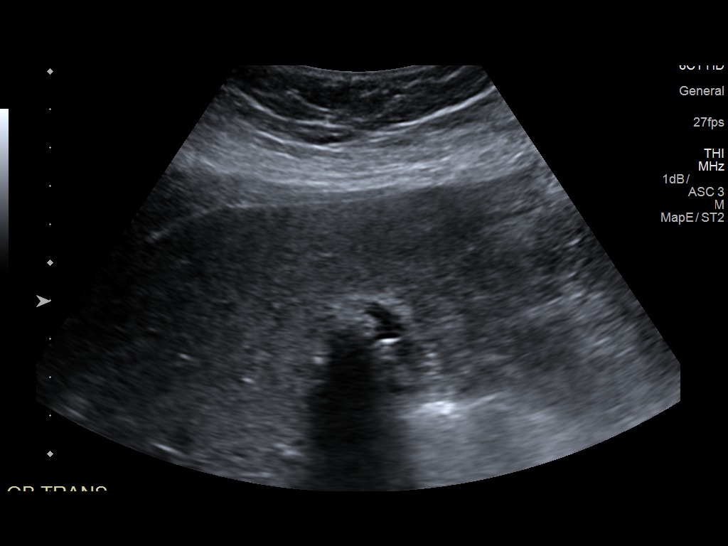
[im 27/41]
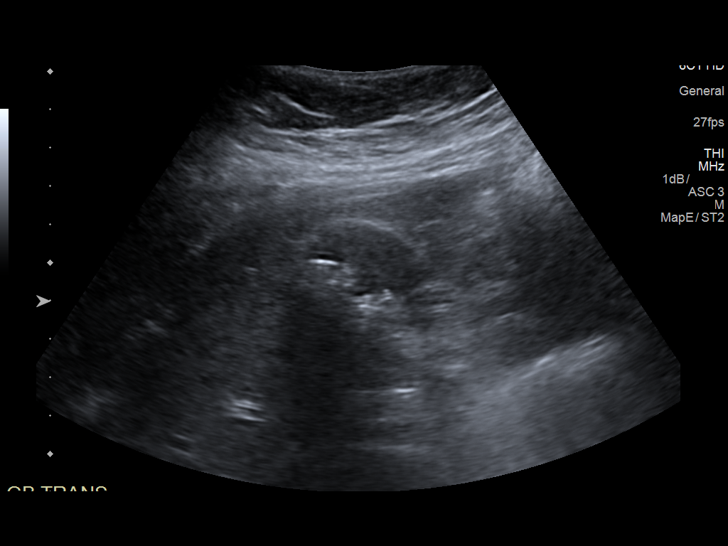
[im 31/41]
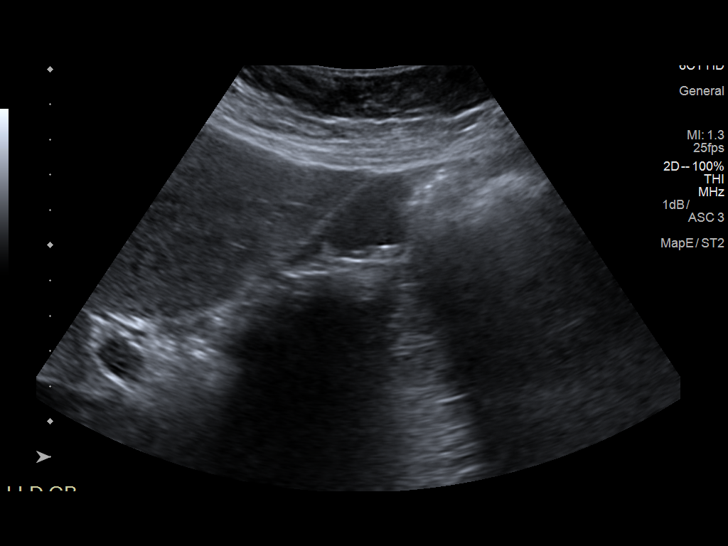
[im 34/41]
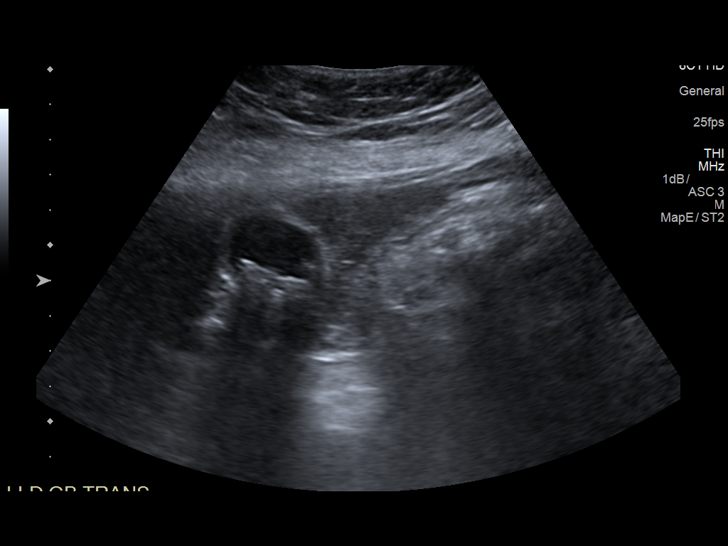
[im 37/41]
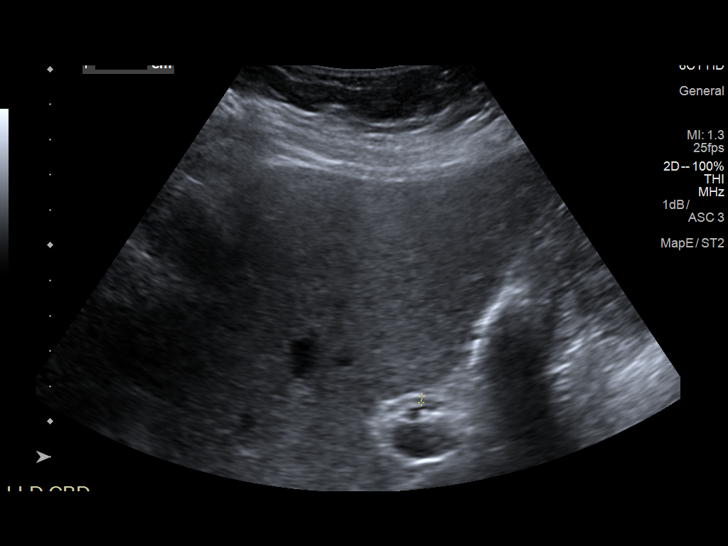
[im 41/41]
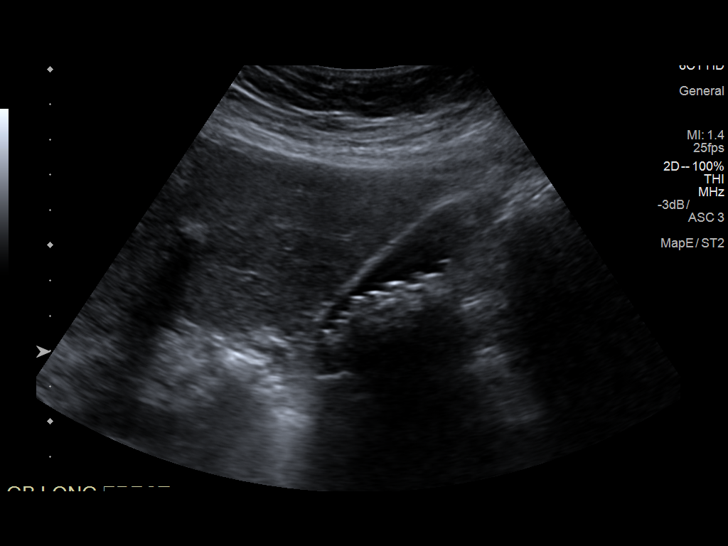

[14 of 25 positions shown; findings below may reference images not displayed]

FINDINGS: Gallbladder:

Multiple echogenic stones present. Gallbladder wall measured at the
upper limits of normal at 3.6 mm. No sonographic Murphy sign
elicited on exam. No free pericholecystic fluid.

Common bile duct:

Diameter: 1.8 mm.

Liver:

No focal lesion identified.  Increased echogenicity.
IMPRESSION: 1. Cholelithiasis without additional sonographic features to suggest
acute cholecystitis. No biliary dilatation.
2. Increased hepatic echogenicity, suggestive of steatosis.

## 2018-10-24 IMAGING — US US MFM OB COMPLETE +14 WKS
1 series · 14 of 28 positions shown · non-contrast
Comparison: none

[Series 1: us mfm ob complete +14 wks · 57 acquisitions, 14 frames shown]
[im 3/57]
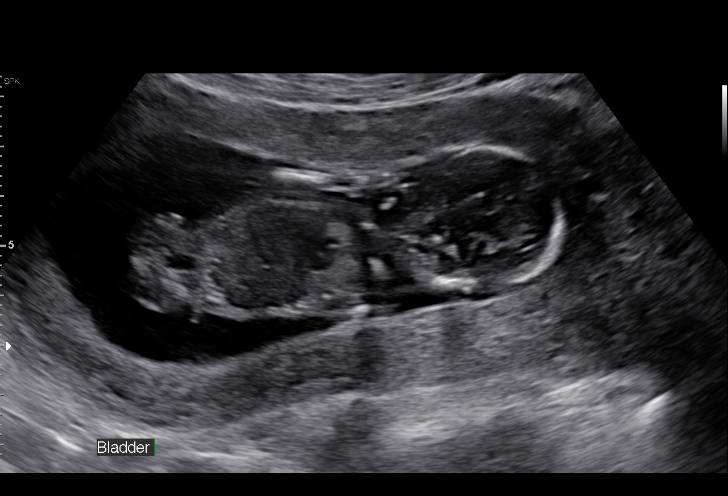
[im 7/57]
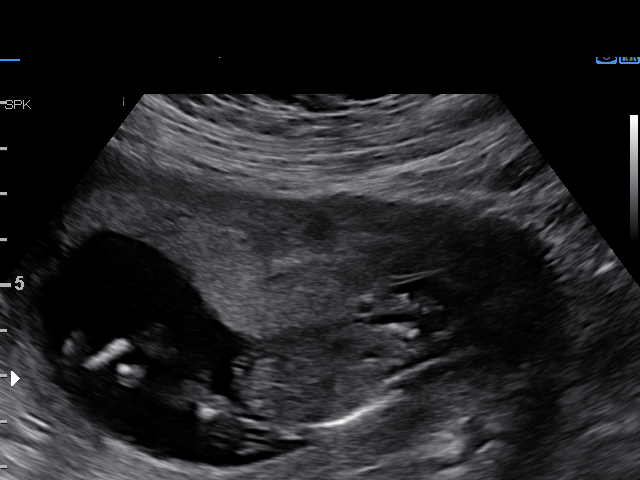
[im 11/57]
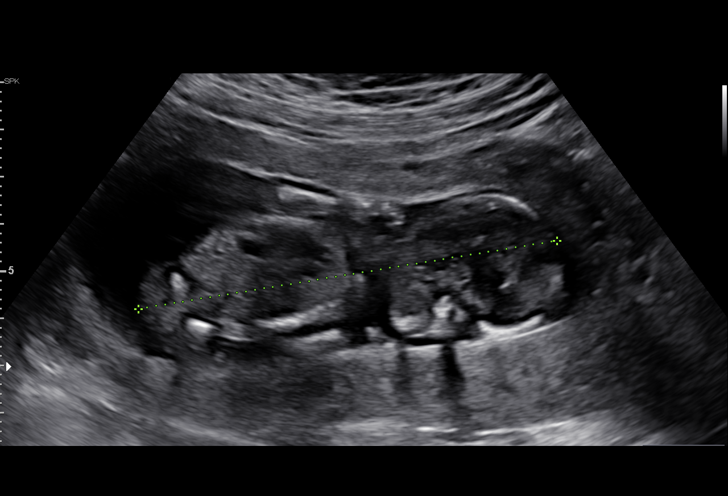
[im 15/57]
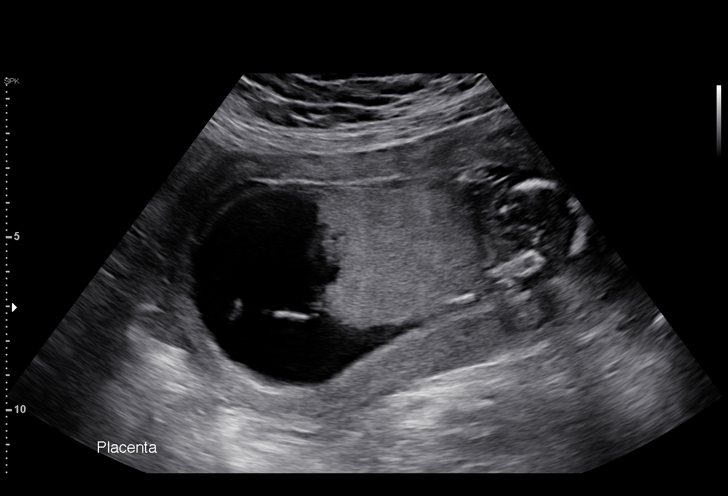
[im 19/57]
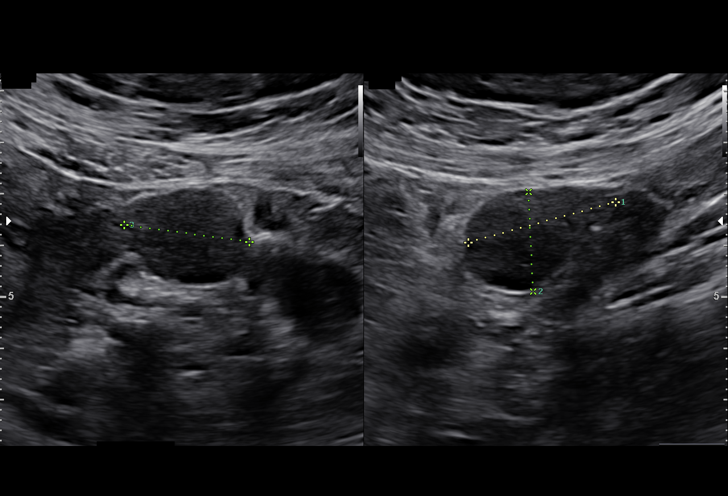
[im 23/57]
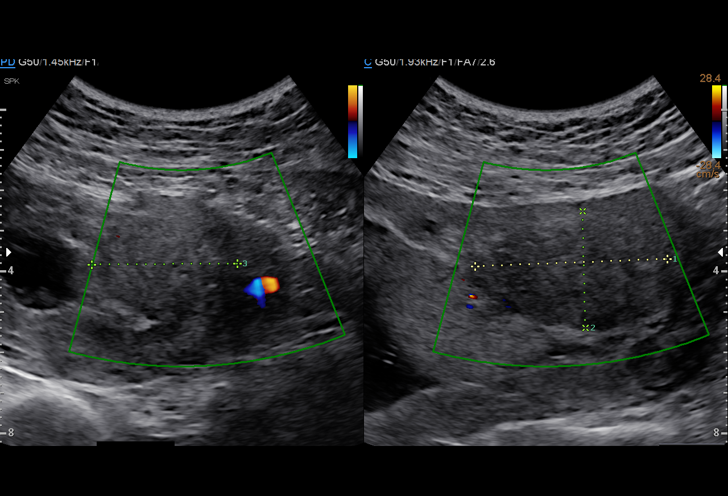
[im 27/57]
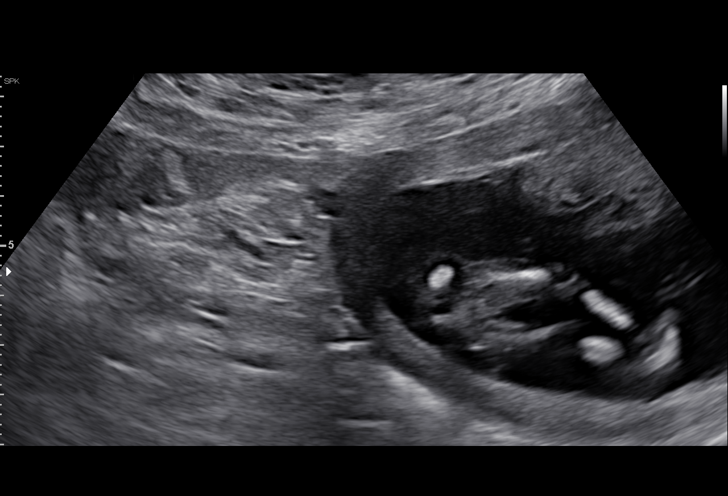
[im 32/57]
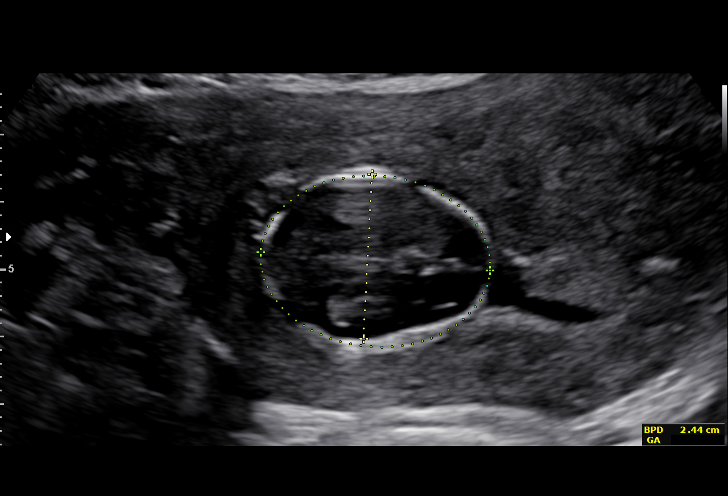
[im 36/57]
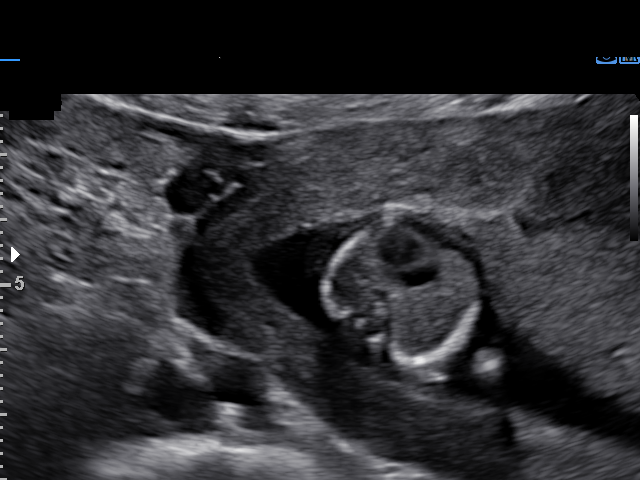
[im 40/57]
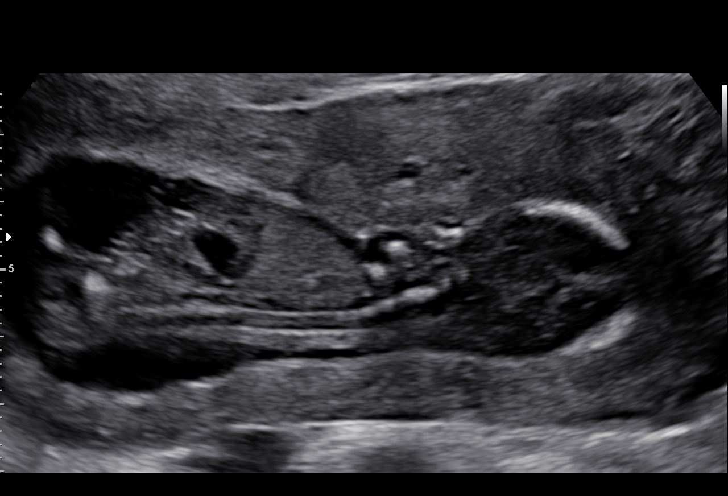
[im 44/57]
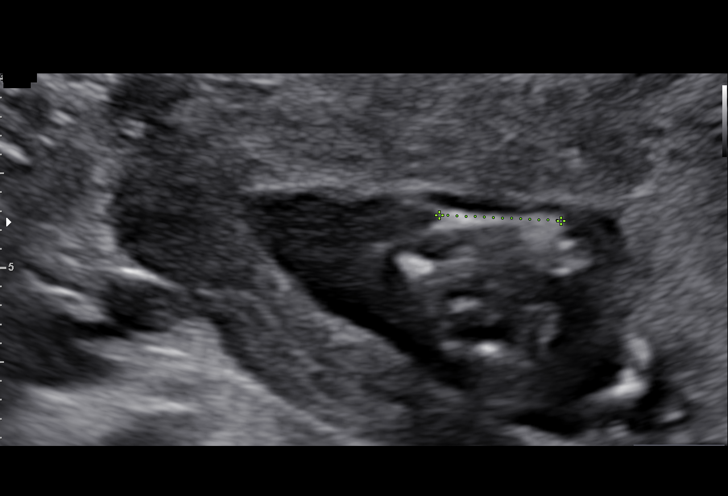
[im 48/57]
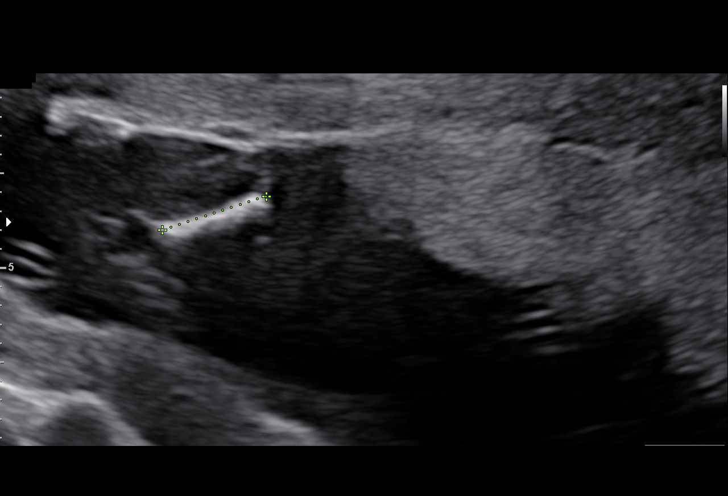
[im 52/57]
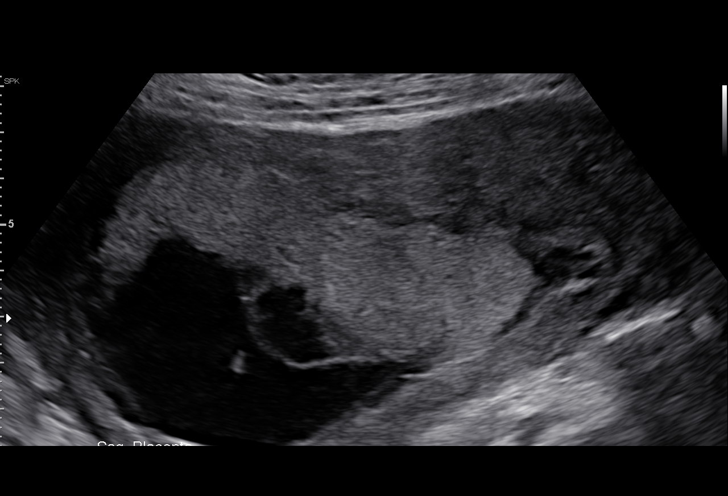
[im 57/57]
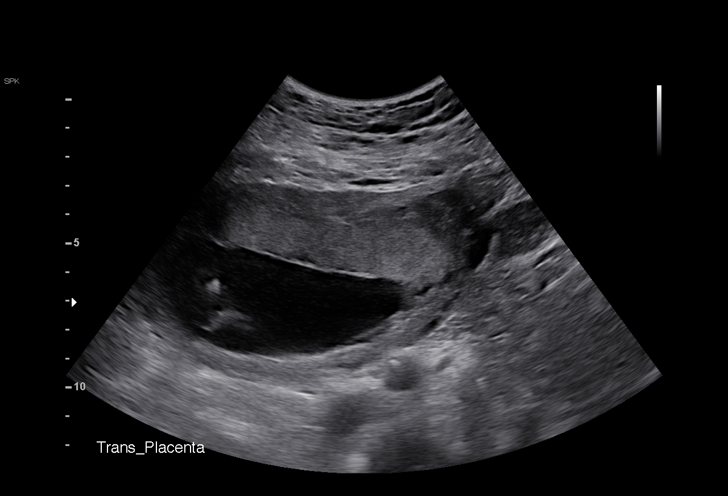

[14 of 28 positions shown; findings below may reference images not displayed]

WEEKS

1  EUNICE JOY ILLUSTRISIMO           [id number]      [id number]     [id number]
Indications

13 weeks gestation of pregnancy
Encounter for nuchal translucency
Obesity complicating pregnancy, first
trimester
Advanced maternal age multigravida 35+
(40), first trimester
Medical complication of pregnancy (specify):
symptomatic cholelithiasis
OB History

Blood Type:            Height:  4'11"  Weight (lb):  172      BMI:
Gravidity:    9         Term:   8
Living:       7
Fetal Evaluation

Num Of Fetuses:     1
Fetal Heart         153
Rate(bpm):
Cardiac Activity:   Observed
Presentation:       Cephalic
Placenta:           Anterior, above cervical os

Amniotic Fluid
AFI FV:      Subjectively within normal limits
Biometry

CRL:      88.8  mm     G. Age:  N/A                     EDD:

BPD:      24.3  mm     G. Age:  14w 1d                  CI:        69.43   %   70 - 86
FL/HC:      13.9   %
HC:       93.1  mm     G. Age:  14w 1d                  HC/AC:      1.15       1.14 -
AC:       80.8  mm     G. Age:  14w 3d                  FL/BPD:     53.1   %
FL:       12.9  mm     G. Age:  13w 5d                  FL/AC:      16.0   %   20 - 24
HUM:      13.1  mm     G. Age:  13w 4d

Est. FW:      91  gm      0 lb 3 oz
Gestational Age

LMP:           13w 5d       Date:   09/01/16                 EDD:   06/08/17
U/S Today:     14w 1d                                        EDD:   06/05/17
Best:          13w 5d    Det. By:   LMP  (09/01/16)          EDD:   06/08/17
Anatomy

Cranium:               Appears normal         Aortic Arch:            Not well visualized
Cavum:                 Appears normal         Ductal Arch:            Not well visualized
Ventricles:            Not well visualized    Diaphragm:              Not well visualized
Choroid Plexus:        Appears normal         Stomach:                Visualized
Cerebellum:            Not well visualized    Abdomen:                Appears normal
Posterior Fossa:       Not well visualized    Abdominal Wall:         Not well visualized
Nuchal Fold:           Not applicable (< 16   Cord Vessels:           Not well visualized
wks GA)
Face:                  Not well visualized    Kidneys:                Not well visualized
Lips:                  Appears normal         Bladder:                Appears normal
Thoracic:              Appears normal         Spine:                  Not well visualized
Heart:                 Not well visualized    Upper Extremities:      Visualized
RVOT:                  Not well visualized    Lower Extremities:      Visualized
LVOT:                  Not well visualized

Other:  Early gestational; age
Cervix Uterus Adnexa

Uterus
No abnormality visualized.

Left Ovary
Within normal limits.

Right Ovary
Within normal limits.

Adnexa:       No abnormality visualized.
Impression

SIUP at 13+5 weeks
No gross abnormalities identified
Normal amniotic fluid volume
Measurements consistent with LMP dating

After counseling, Ms. Liah Membreno decided not to pursue
aneuploidy screening or testing.
Recommendations

Offer MSAFP in the second trimester for ONTD screening
Offer detailed U/S by 18 weeks

## 2022-12-25 ENCOUNTER — Other Ambulatory Visit: Payer: Self-pay

## 2022-12-25 ENCOUNTER — Ambulatory Visit (HOSPITAL_COMMUNITY)
Admission: EM | Admit: 2022-12-25 | Discharge: 2022-12-25 | Disposition: A | Discharge status: Home / Self Care | Payer: Self-pay | Attending: Emergency Medicine | Admitting: Emergency Medicine

## 2022-12-25 ENCOUNTER — Encounter (HOSPITAL_COMMUNITY): Payer: Self-pay | Admitting: Emergency Medicine

## 2022-12-25 DIAGNOSIS — N912 Amenorrhea, unspecified: Secondary | ICD-10-CM | POA: Insufficient documentation

## 2022-12-25 DIAGNOSIS — R5383 Other fatigue: Secondary | ICD-10-CM | POA: Insufficient documentation

## 2022-12-25 LAB — CBC WITH DIFFERENTIAL/PLATELET
Abs Immature Granulocytes: 0.03 10*3/uL (ref 0.00–0.07)
Basophils Absolute: 0.1 10*3/uL (ref 0.0–0.1)
Basophils Relative: 1 %
Eosinophils Absolute: 0.7 10*3/uL — ABNORMAL HIGH (ref 0.0–0.5)
Eosinophils Relative: 8 %
HCT: 39.4 % (ref 36.0–46.0)
Hemoglobin: 13.1 g/dL (ref 12.0–15.0)
Immature Granulocytes: 0 %
Lymphocytes Relative: 27 %
Lymphs Abs: 2.5 10*3/uL (ref 0.7–4.0)
MCH: 28.3 pg (ref 26.0–34.0)
MCHC: 33.2 g/dL (ref 30.0–36.0)
MCV: 85.1 fL (ref 80.0–100.0)
Monocytes Absolute: 0.6 10*3/uL (ref 0.1–1.0)
Monocytes Relative: 6 %
Neutro Abs: 5.4 10*3/uL (ref 1.7–7.7)
Neutrophils Relative %: 58 %
Platelets: 318 10*3/uL (ref 150–400)
RBC: 4.63 MIL/uL (ref 3.87–5.11)
RDW: 14.6 % (ref 11.5–15.5)
WBC: 9.2 10*3/uL (ref 4.0–10.5)
nRBC: 0 % (ref 0.0–0.2)

## 2022-12-25 LAB — COMPREHENSIVE METABOLIC PANEL
ALT: 23 U/L (ref 0–44)
AST: 22 U/L (ref 15–41)
Albumin: 3.9 g/dL (ref 3.5–5.0)
Alkaline Phosphatase: 86 U/L (ref 38–126)
Anion gap: 10 (ref 5–15)
BUN: 12 mg/dL (ref 6–20)
CO2: 27 mmol/L (ref 22–32)
Calcium: 10 mg/dL (ref 8.9–10.3)
Chloride: 99 mmol/L (ref 98–111)
Creatinine, Ser: 0.63 mg/dL (ref 0.44–1.00)
GFR, Estimated: >60 mL/min (ref >60)
Glucose, Bld: 88 mg/dL (ref 70–99)
Potassium: 4.3 mmol/L (ref 3.5–5.1)
Sodium: 136 mmol/L (ref 135–145)
Total Bilirubin: 0.4 mg/dL (ref 0.3–1.2)
Total Protein: 8 g/dL (ref 6.5–8.1)

## 2022-12-25 LAB — TSH: TSH: 2.278 u[IU]/mL (ref 0.350–4.500)

## 2022-12-25 LAB — CBG MONITORING, ED: Glucose-Capillary: 89 mg/dL (ref 70–99)

## 2022-12-25 LAB — POC URINE PREG, ED: Preg Test, Ur: NEGATIVE

## 2022-12-25 NOTE — ED Provider Notes (Signed)
[facility]    CSN: [id number] Arrival date & time: 12/25/22  [id number]     History   Chief Complaint Chief Complaint  Patient presents with   Generalized Body Aches    HPI Beverly Flynn is a 47 y.o. female.  Here with 2 month history of intermittent headache, fatigue Body overall feels heavy, tired Reports "fever" sometimes at night. No temps taken, feels hot Denies runny nose, cough, sore throat. Today headache is 2/10 Reports relief with tylenol  Reports has not had menstrual cycle since June  Does not have PCP  Past Medical History:  Diagnosis Date   AMA (advanced maternal age) multigravida 71+, third trimester 05/27/2017   Gall stones    History of IUFD 05/27/2017   Indication for care in labor or delivery 05/27/2017   Postpartum care following vaginal delivery 05/28/2017    Patient Active Problem List   Diagnosis Date Noted   SVD (spontaneous vaginal delivery) 05/29/2017   Postpartum care following vaginal delivery 05/28/2017   AMA (advanced maternal age) multigravida 35+, third trimester 05/27/2017   History of IUFD 05/27/2017   Active labor at term 12/03/2013   Active labor 12/03/2013    Past Surgical History:  Procedure Laterality Date   CHOLECYSTECTOMY  01/04/2017   at [facility]    OB History     Gravida  9   Para  9   Term  9   Preterm      AB  0   Living  8      SAB  0   IAB      Ectopic      Multiple  0   Live Births  8            Home Medications    Prior to Admission medications   Medication Sig Start Date End Date Taking? Authorizing Provider  ferrous sulfate 325 (65 FE) MG tablet Take 1 tablet (325 mg total) by mouth 2 (two) times daily with a meal. Patient not taking: Reported on 12/25/2022 05/29/17 05/29/18  Degele, Jenne Pane, MD  ibuprofen (ADVIL,MOTRIN) 600 MG tablet Take 1 tablet (600 mg total) by mouth every 6 (six) hours. Patient not taking: Reported on 12/25/2022 05/29/17    Degele, Jenne Pane, MD  Prenatal Vit-Fe Fumarate-FA (PRENATAL MULTIVITAMIN) TABS Take 1 tablet by mouth daily. Patient not taking: Reported on 12/25/2022    [provider]    Family History History reviewed. No pertinent family history.  Social History Social History   Tobacco Use   Smoking status: Never   Smokeless tobacco: Never  Vaping Use   Vaping Use: Never used  Substance Use Topics   Alcohol use: No   Drug use: No     Allergies   Patient has no known allergies.   Review of Systems Review of Systems As per HPI  Physical Exam Triage Vital Signs ED Triage Vitals  Enc Vitals Group     BP 12/25/22 1015 109/65     Pulse Rate 12/25/22 1015 77     Resp 12/25/22 1015 20     Temp 12/25/22 1015 98 F (36.7 C)     Temp Source 12/25/22 1015 Oral     SpO2 12/25/22 1015 98 %     Weight --      Height --      Head Circumference --      Peak Flow --      Pain Score 12/25/22 1010 3  Pain Loc --      Pain Edu? --      Excl. in [place]? --    No data found.  Updated Vital Signs BP 109/65 (BP Location: Right Arm) Comment (BP Location): large cuff  Pulse 77   Temp 98 F (36.7 C) (Oral)   Resp 20   LMP 04/20/2022 (Approximate)   SpO2 98%   Physical Exam Vitals and nursing note reviewed.  Constitutional:      General: She is not in acute distress.    Appearance: She is well-developed. She is not ill-appearing.  HENT:     Head: Normocephalic and atraumatic.     Mouth/Throat:     Mouth: Mucous membranes are moist.     Pharynx: Oropharynx is clear.  Eyes:     Extraocular Movements: Extraocular movements intact.     Conjunctiva/sclera: Conjunctivae normal.     Pupils: Pupils are equal, round, and reactive to light.  Cardiovascular:     Rate and Rhythm: Normal rate and regular rhythm.     Pulses: Normal pulses.     Heart sounds: Normal heart sounds.  Pulmonary:     Effort: Pulmonary effort is normal. No respiratory distress.     Breath sounds: Normal  breath sounds.  Abdominal:     Palpations: Abdomen is soft.     Tenderness: There is no abdominal tenderness. There is no guarding.  Musculoskeletal:        General: No swelling. Normal range of motion.     Cervical back: Normal range of motion and neck supple.  Lymphadenopathy:     Cervical: No cervical adenopathy.  Skin:    General: Skin is warm and dry.     Capillary Refill: Capillary refill takes less than 2 seconds.  Neurological:     General: No focal deficit present.     Mental Status: She is alert and oriented to person, place, and time.     Cranial Nerves: Cranial nerves 2-12 are intact.     Sensory: Sensation is intact.     Motor: Motor function is intact.     Comments: Strength intact  Psychiatric:        Mood and Affect: Mood normal.     UC Treatments / Results  Labs (all labs ordered are listed, but only abnormal results are displayed) Labs Reviewed  CBC WITH DIFFERENTIAL/PLATELET  COMPREHENSIVE METABOLIC PANEL  TSH  POC URINE PREG, ED  CBG MONITORING, ED    EKG   Radiology No results found.  Procedures Procedures (including critical care time)  Medications Ordered in UC Medications - No data to display  Initial Impression / Assessment and Plan / UC Course  I have reviewed the triage vital signs and the nursing notes.  Pertinent labs & imaging results that were available during my care of the patient were reviewed by me and considered in my medical decision making (see chart for details).  No red flags today. Good exam. UPT negative CBG 89 Will check basic labs. CBC, CMP, TSH pending.   Set up with PCP via open scheduling - appointment for this Friday. Close follow up. Consider perimenopause as etiology.   Return and ED precautions discussed. Patient agrees to plan  Final Clinical Impressions(s) / UC Diagnoses   Final diagnoses:  Other fatigue  Lack of menses     Discharge Instructions      we will call you if anything returns  abnormal on your blood work, either tonight or tomorrow. please follow  with your new primary care provider for further evaluation   Lo llamaremos si hay algo anormal en su anlisis de sangre, ya sea esta noche o [place]. consulte con su nuevo proveedor de atencin primaria para una evaluacin adicional     ED Prescriptions   None    PDMP not reviewed this encounter.   Syd Manges, Vernice Jefferson 12/25/22 1152

## 2022-12-25 NOTE — Discharge Instructions (Signed)
we will call you if anything returns abnormal on your blood work, either tonight or tomorrow. please follow with your new primary care provider for further evaluation   Lo llamaremos si hay algo anormal en su anlisis de [place], ya sea esta noche o [place]. consulte con su nuevo proveedor de atencin primaria para Educational psychologist

## 2022-12-25 NOTE — ED Triage Notes (Signed)
Headaches, fatigue, and at night time has fever are complaints today .  No cough, no runny nose.    Symptoms started 2 months ago.  Fever at night is intermittent.   Has not had period since July.    Clarified "fever at night"-feels very hot and has chills, but has not verified a fever with thermometer.     Patient does not have a pcp

## 2022-12-29 ENCOUNTER — Ambulatory Visit: Payer: Self-pay | Admitting: Nurse Practitioner

## 2024-01-08 ENCOUNTER — Other Ambulatory Visit: Payer: Self-pay | Admitting: Obstetrics and Gynecology

## 2024-01-08 DIAGNOSIS — Z1231 Encounter for screening mammogram for malignant neoplasm of breast: Secondary | ICD-10-CM

## 2024-03-06 ENCOUNTER — Ambulatory Visit: Payer: Self-pay | Admitting: *Deleted

## 2024-03-06 ENCOUNTER — Ambulatory Visit
Admission: RE | Admit: 2024-03-06 | Discharge: 2024-03-06 | Disposition: A | Discharge status: Home / Self Care | Payer: Self-pay | Source: Ambulatory Visit | Attending: Obstetrics and Gynecology | Admitting: Obstetrics and Gynecology

## 2024-03-06 VITALS — Wt 173.0 lb

## 2024-03-06 DIAGNOSIS — Z1211 Encounter for screening for malignant neoplasm of colon: Secondary | ICD-10-CM

## 2024-03-06 DIAGNOSIS — Z1231 Encounter for screening mammogram for malignant neoplasm of breast: Secondary | ICD-10-CM

## 2024-03-06 DIAGNOSIS — Z01419 Encounter for gynecological examination (general) (routine) without abnormal findings: Secondary | ICD-10-CM

## 2024-03-06 NOTE — Patient Instructions (Signed)
 Explained breast self awareness with Rogene Claude. Pap smear complete. Let her know [facility] will cover Pap smears and HPV typing every 5 years unless has a history of abnormal Pap smears. Referred patient to the [facility] for a screening mammogram on mobile unit. Appointment scheduled Thursday, Mar 06, 2024 at 1120. Patient aware of appointment and will be there. Let patient know will follow up with her within the next couple weeks with results of Pap smear by letter or phone. Informed patient that the Breast Center will follow up with her within the next couple of weeks with results of her mammogram by letter or phone. Rogene Claude verbalized understanding.  Catalina Salasar, Dela Favor, RN 11:12 AM

## 2024-03-06 NOTE — Progress Notes (Signed)
 [name] is a 48 y.o. [phone] female who presents to [facility] clinic today with no complaints.    Pap Smear: Pap smear completed today. Last Pap smear was 01/04/2024 at [facility] and was Unsatisfactory for Evaluation and negative HPV. Per patient has no history of an abnormal Pap smear. Last Pap smear result is available in Epic.   Physical exam: Breasts Breasts symmetrical. No skin abnormalities bilateral breasts. No nipple retraction bilateral breasts. No nipple discharge bilateral breasts. No lymphadenopathy. No lumps palpated bilateral breasts. No complaints of pain or tenderness on exam.      Pelvic/Bimanual Ext Genitalia No lesions, no swelling and no discharge observed on external genitalia.        Vagina Vagina pink and normal texture. No lesions or discharge observed in vagina.        Cervix Cervix is present. Cervix pink and of normal texture. Cervix friable. No discharge observed.    Uterus Uterus is present and palpable. Uterus in normal position and normal size.        Adnexae Bilateral ovaries present and palpable. No tenderness on palpation.         Rectovaginal No rectal exam completed today since patient had no rectal complaints. No skin abnormalities observed on exam.     Smoking History: Patient has never smoked.   Patient Navigation: Patient education provided. Access to services provided for patient through [facility] program. Spanish interpreter Beverly Flynn from [facility] provided.   Colorectal Cancer Screening: Per patient has never had colonoscopy completed. FIT Test given to patient to complete. No complaints today.    Breast and Cervical Cancer Risk Assessment: Patient does not have family history of breast cancer, known genetic mutations, or radiation treatment to the chest before age 58. Patient does not have history of cervical dysplasia, immunocompromised, or DES exposure in-utero.  Risk Scores as of  Encounter on 03/06/2024     Beverly Flynn           5-year 0.36%   Lifetime 4.36%            Last calculated by Beverly Edge, RN on 03/06/2024 at 11:15 AM          A: [facility] exam with pap smear No complaints.  P: Referred patient to the [facility] for a screening mammogram on mobile unit. Appointment scheduled Thursday, Mar 06, 2024 at 1120.  Beverly Edge, RN 03/06/2024 11:12 AM

## 2024-03-11 ENCOUNTER — Other Ambulatory Visit: Payer: Self-pay | Admitting: Obstetrics and Gynecology

## 2024-03-11 DIAGNOSIS — R928 Other abnormal and inconclusive findings on diagnostic imaging of breast: Secondary | ICD-10-CM

## 2024-03-11 LAB — CYTOLOGY - PAP
Comment: NEGATIVE
Diagnosis: NEGATIVE
Diagnosis: REACTIVE
High risk HPV: NEGATIVE

## 2024-03-19 ENCOUNTER — Ambulatory Visit: Payer: Self-pay

## 2024-03-26 ENCOUNTER — Encounter

## 2024-03-26 ENCOUNTER — Inpatient Hospital Stay: Admission: RE | Admit: 2024-03-26 | Source: Ambulatory Visit
